# Patient Record
Sex: Male | Born: 1975 | Race: White | Hispanic: No | Marital: Single | Smoking: Never smoker
Health system: Southern US, Community
[De-identification: ages and names within clinical notes are randomized; demographics above are authoritative.]

## PROBLEM LIST (undated history)

## (undated) DIAGNOSIS — F419 Anxiety disorder, unspecified: Secondary | ICD-10-CM

## (undated) DIAGNOSIS — H7090 Unspecified mastoiditis, unspecified ear: Secondary | ICD-10-CM

## (undated) DIAGNOSIS — H7092 Unspecified mastoiditis, left ear: Secondary | ICD-10-CM

## (undated) HISTORY — PX: OTHER SURGICAL HISTORY: SHX169

## (undated) HISTORY — PX: MASTOIDECTOMY REVISION: SUR856

## (undated) HISTORY — PX: EAR CYST EXCISION: SHX22

---

## 1998-05-13 ENCOUNTER — Emergency Department (HOSPITAL_COMMUNITY): Admission: EM | Admit: 1998-05-13 | Discharge: 1998-05-13 | Payer: Self-pay | Admitting: Internal Medicine

## 1998-05-13 ENCOUNTER — Encounter: Payer: Self-pay | Admitting: Internal Medicine

## 1998-05-18 ENCOUNTER — Encounter: Admission: RE | Admit: 1998-05-18 | Discharge: 1998-05-18 | Payer: Self-pay | Admitting: *Deleted

## 1998-07-04 ENCOUNTER — Emergency Department (HOSPITAL_COMMUNITY): Admission: EM | Admit: 1998-07-04 | Discharge: 1998-07-04 | Payer: Self-pay | Admitting: Emergency Medicine

## 1998-08-30 ENCOUNTER — Emergency Department (HOSPITAL_COMMUNITY): Admission: EM | Admit: 1998-08-30 | Discharge: 1998-08-31 | Payer: Self-pay | Admitting: Emergency Medicine

## 1998-08-30 ENCOUNTER — Encounter: Payer: Self-pay | Admitting: Emergency Medicine

## 1998-10-31 ENCOUNTER — Other Ambulatory Visit: Admission: RE | Admit: 1998-10-31 | Discharge: 1998-10-31 | Payer: Self-pay | Admitting: Otolaryngology

## 2002-01-04 ENCOUNTER — Emergency Department (HOSPITAL_COMMUNITY): Admission: EM | Admit: 2002-01-04 | Discharge: 2002-01-04 | Payer: Self-pay | Admitting: *Deleted

## 2003-03-24 ENCOUNTER — Emergency Department (HOSPITAL_COMMUNITY): Admission: EM | Admit: 2003-03-24 | Discharge: 2003-03-25 | Payer: Self-pay | Admitting: Emergency Medicine

## 2003-04-05 ENCOUNTER — Encounter: Payer: Self-pay | Admitting: Sports Medicine

## 2003-04-05 ENCOUNTER — Encounter: Payer: Self-pay | Admitting: Internal Medicine

## 2003-04-05 ENCOUNTER — Ambulatory Visit (HOSPITAL_COMMUNITY): Admission: RE | Admit: 2003-04-05 | Discharge: 2003-04-05 | Payer: Self-pay | Admitting: Sports Medicine

## 2003-04-05 ENCOUNTER — Ambulatory Visit (HOSPITAL_COMMUNITY): Admission: RE | Admit: 2003-04-05 | Discharge: 2003-04-05 | Payer: Self-pay | Admitting: Internal Medicine

## 2004-11-20 ENCOUNTER — Ambulatory Visit (HOSPITAL_COMMUNITY): Admission: RE | Admit: 2004-11-20 | Discharge: 2004-11-20 | Payer: Self-pay | Admitting: Family Medicine

## 2005-11-22 ENCOUNTER — Ambulatory Visit (HOSPITAL_COMMUNITY): Admission: RE | Admit: 2005-11-22 | Discharge: 2005-11-22 | Payer: Self-pay | Admitting: Family Medicine

## 2006-07-14 ENCOUNTER — Emergency Department (HOSPITAL_COMMUNITY): Admission: EM | Admit: 2006-07-14 | Discharge: 2006-07-14 | Payer: Self-pay | Admitting: Emergency Medicine

## 2006-07-16 ENCOUNTER — Emergency Department (HOSPITAL_COMMUNITY): Admission: EM | Admit: 2006-07-16 | Discharge: 2006-07-16 | Payer: Self-pay | Admitting: Emergency Medicine

## 2007-04-29 ENCOUNTER — Emergency Department (HOSPITAL_COMMUNITY): Admission: EM | Admit: 2007-04-29 | Discharge: 2007-04-29 | Payer: Self-pay | Admitting: Emergency Medicine

## 2007-09-02 ENCOUNTER — Inpatient Hospital Stay (HOSPITAL_COMMUNITY): Admission: EM | Admit: 2007-09-02 | Discharge: 2007-09-03 | Payer: Self-pay | Admitting: Emergency Medicine

## 2008-05-10 ENCOUNTER — Emergency Department (HOSPITAL_COMMUNITY): Admission: EM | Admit: 2008-05-10 | Discharge: 2008-05-10 | Payer: Self-pay | Admitting: Emergency Medicine

## 2008-05-18 ENCOUNTER — Emergency Department (HOSPITAL_COMMUNITY): Admission: EM | Admit: 2008-05-18 | Discharge: 2008-05-18 | Payer: Self-pay | Admitting: Emergency Medicine

## 2008-12-28 ENCOUNTER — Emergency Department (HOSPITAL_BASED_OUTPATIENT_CLINIC_OR_DEPARTMENT_OTHER): Admission: EM | Admit: 2008-12-28 | Discharge: 2008-12-28 | Payer: Self-pay | Admitting: Emergency Medicine

## 2009-06-01 ENCOUNTER — Emergency Department (HOSPITAL_BASED_OUTPATIENT_CLINIC_OR_DEPARTMENT_OTHER): Admission: EM | Admit: 2009-06-01 | Discharge: 2009-06-01 | Payer: Self-pay | Admitting: Emergency Medicine

## 2009-06-01 ENCOUNTER — Ambulatory Visit: Payer: Self-pay | Admitting: Diagnostic Radiology

## 2009-06-02 ENCOUNTER — Emergency Department (HOSPITAL_BASED_OUTPATIENT_CLINIC_OR_DEPARTMENT_OTHER): Admission: EM | Admit: 2009-06-02 | Discharge: 2009-06-02 | Payer: Self-pay | Admitting: Emergency Medicine

## 2010-02-12 ENCOUNTER — Emergency Department (HOSPITAL_COMMUNITY): Admission: EM | Admit: 2010-02-12 | Discharge: 2010-02-12 | Payer: Self-pay | Admitting: Emergency Medicine

## 2010-09-17 ENCOUNTER — Encounter: Payer: Self-pay | Admitting: Internal Medicine

## 2010-11-12 LAB — BASIC METABOLIC PANEL
BUN: 15 mg/dL (ref 6–23)
CO2: 24 mEq/L (ref 19–32)
Chloride: 108 mEq/L (ref 96–112)
Creatinine, Ser: 0.89 mg/dL (ref 0.4–1.5)
GFR calc non Af Amer: >60 mL/min (ref >60)
Potassium: 3.8 mEq/L (ref 3.5–5.1)
Sodium: 136 mEq/L (ref 135–145)

## 2010-11-12 LAB — DIFFERENTIAL
Basophils Relative: 0 % (ref 0–1)
Eosinophils Relative: 5 % (ref 0–5)
Lymphs Abs: 1.2 10*3/uL (ref 0.7–4.0)
Monocytes Relative: 9 % (ref 3–12)
Neutro Abs: 3.8 10*3/uL (ref 1.7–7.7)

## 2010-11-12 LAB — ETHANOL: Alcohol, Ethyl (B): <5 mg/dL (ref 0–10)

## 2010-11-12 LAB — RAPID URINE DRUG SCREEN, HOSP PERFORMED
Amphetamines: NOT DETECTED
Barbiturates: NOT DETECTED
Benzodiazepines: POSITIVE — AB
Opiates: POSITIVE — AB
Tetrahydrocannabinol: NOT DETECTED

## 2010-11-12 LAB — CBC
HCT: 44.2 % (ref 39.0–52.0)
Platelets: 340 10*3/uL (ref 150–400)
RBC: 4.89 MIL/uL (ref 4.22–5.81)
WBC: 5.7 10*3/uL (ref 4.0–10.5)

## 2010-11-12 LAB — TRICYCLICS SCREEN, URINE: TCA Scrn: NOT DETECTED
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
## 2011-01-26 ENCOUNTER — Emergency Department (HOSPITAL_COMMUNITY)
Admission: EM | Admit: 2011-01-26 | Discharge: 2011-01-27 | Disposition: A | Discharge status: Home / Self Care | Payer: Self-pay | Attending: Emergency Medicine | Admitting: Emergency Medicine

## 2011-01-26 DIAGNOSIS — Y998 Other external cause status: Secondary | ICD-10-CM | POA: Insufficient documentation

## 2011-01-26 DIAGNOSIS — S51809A Unspecified open wound of unspecified forearm, initial encounter: Secondary | ICD-10-CM | POA: Insufficient documentation

## 2011-01-26 DIAGNOSIS — S6000XA Contusion of unspecified finger without damage to nail, initial encounter: Secondary | ICD-10-CM | POA: Insufficient documentation

## 2011-01-26 DIAGNOSIS — F17200 Nicotine dependence, unspecified, uncomplicated: Secondary | ICD-10-CM | POA: Insufficient documentation

## 2011-01-26 DIAGNOSIS — IMO0002 Reserved for concepts with insufficient information to code with codable children: Secondary | ICD-10-CM | POA: Insufficient documentation

## 2011-01-27 ENCOUNTER — Emergency Department (HOSPITAL_COMMUNITY): Payer: Self-pay

## 2011-05-17 LAB — CBC
HCT: 44.2
Hemoglobin: 15
MCHC: 33.9
MCHC: 34.4
MCV: 90.2
Platelets: 291
Platelets: 314
RBC: 4.9
RDW: 12.8
RDW: 12.9
WBC: 15.1 — ABNORMAL HIGH

## 2011-05-17 LAB — I-STAT 8, (EC8 V) (CONVERTED LAB)
Acid-base deficit: 3 — ABNORMAL HIGH
Bicarbonate: 23.3
Chloride: 110
Glucose, Bld: 87
Sodium: 141
TCO2: 25

## 2011-05-17 LAB — POCT CARDIAC MARKERS
CKMB, poc: 3.3
Myoglobin, poc: 482
Operator id: 272551

## 2011-05-17 LAB — DIFFERENTIAL
Basophils Absolute: 0
Basophils Relative: 0
Eosinophils Absolute: 0.1
Eosinophils Relative: 0

## 2011-05-17 LAB — ETHANOL

## 2011-05-17 LAB — BASIC METABOLIC PANEL
BUN: 7
CO2: 23
Calcium: 8.6
Creatinine, Ser: 0.94
GFR calc non Af Amer: >60
Glucose, Bld: 141 — ABNORMAL HIGH
Sodium: 138

## 2011-05-17 LAB — POCT I-STAT CREATININE
Creatinine, Ser: 1.1
Operator id: 272551

## 2011-05-23 DIAGNOSIS — H9209 Otalgia, unspecified ear: Secondary | ICD-10-CM | POA: Insufficient documentation

## 2011-05-24 ENCOUNTER — Emergency Department (HOSPITAL_BASED_OUTPATIENT_CLINIC_OR_DEPARTMENT_OTHER)
Admission: EM | Admit: 2011-05-24 | Discharge: 2011-05-24 | Disposition: A | Discharge status: Home / Self Care | Payer: Self-pay | Attending: Emergency Medicine | Admitting: Emergency Medicine

## 2011-05-24 ENCOUNTER — Encounter: Payer: Self-pay | Admitting: Emergency Medicine

## 2011-05-24 DIAGNOSIS — H9202 Otalgia, left ear: Secondary | ICD-10-CM

## 2011-05-24 HISTORY — DX: Unspecified mastoiditis, left ear: H70.92

## 2011-05-24 MED ORDER — NEOMYCIN-COLIST-HC-THONZONIUM 3.3-3-10-0.5 MG/ML OT SUSP
4.0000 [drp] | Freq: Four times a day (QID) | OTIC | Status: DC
  Administered 2011-05-24: 4 [drp] via OTIC
  Filled 2011-05-24: qty 5

## 2011-05-24 MED ORDER — AMOXICILLIN 500 MG PO CAPS: 1000.0000 mg | ORAL_CAPSULE | Freq: Three times a day (TID) | ORAL | Status: AC

## 2011-05-24 MED ORDER — HYDROCODONE-ACETAMINOPHEN 5-325 MG PO TABS: 1.0000 | ORAL_TABLET | Freq: Four times a day (QID) | ORAL | Status: AC | PRN

## 2011-05-24 NOTE — ED Notes (Signed)
Pt reports left sided earache x1 month. Worsening in degree of pain. Denies injury. Hx of mastoiditis and states this feels similar. Does not have a PCP or ENT physician at this time. C/o some drainage at time. Ice packs help to relieve pain. Has been taking Advil without much relief. Unable to visualize left inner eardrum d/t large amount of cerumen.

## 2011-05-24 NOTE — ED Notes (Signed)
Dr Read Drivers has assessed pt

## 2011-05-24 NOTE — ED Provider Notes (Addendum)
History     CSN: [id number] Arrival date & time: 05/24/2011 12:06 AM  Chief Complaint  Patient presents with  . Otalgia    Pt reports Hx of mastoiditis with surgical intervention in 2008    (Consider location/radiation/quality/duration/timing/severity/associated sxs/prior treatment) HPI This is a 35 year old white male with a history of left mastoid surgery in 2001 and again in 2008. He presents with a one-month history of pain in his left ear radiating to the left side of his face. There's been no associated fever. Pain is worse with movement of the left ear and with certain positions of the head. Hearing is decreased in the left ear, worse than baseline. He has been taking Advil without adequate relief. Pain is moderate to severe at its worst. He has had intermittent vertigo.  Past Medical History  Diagnosis Date  . Mastoiditis of left side     Past Surgical History  Procedure Date  . Mastoidectomy revision     History reviewed. No pertinent family history.  History  Substance Use Topics  . Smoking status: Former Games developer  . Smokeless tobacco: Not on file  . Alcohol Use: No      Review of Systems  All other systems reviewed and are negative.    Allergies  Review of patient's allergies indicates no known allergies.  Home Medications   Current Outpatient Rx  Name Route Sig Dispense Refill  . IBUPROFEN 200 MG PO TABS Oral Take 800 mg by mouth every 6 (six) hours as needed.        BP 120/84  Pulse 88  Temp(Src) 98.1 F (36.7 C) (Oral)  Resp 16  SpO2 100%  Physical Exam General: Well-developed, well-nourished male in no acute distress; appearance consistent with age of record HENT: normocephalic, atraumatic; right TM pearly gray with light reflex; left TM obscured by exudate and cerumen and left external auditory canal; pain on movement of left external ear; no left mastoid tenderness on percussion Eyes: pupils equal round and reactive to light; extraocular  muscles intact Neck: supple; no lymphadenopathy Heart: regular rate and rhythm Lungs: Normal respiratory effort and excursion Abdomen: nontender; nondistended Extremities: No deformity; full range of motion Neurologic: Awake, alert and oriented;motor function intact in all extremities and symmetric; no facial droop Skin: Warm and dry   ED Course  Procedures (including critical care time) Left ear was placed to facilitate administration of Cortisporin drops.  Labs Reviewed - No data to display No results found.   No diagnosis found.    MDM  We'll treat for both otitis externa and media. Patient is to followup with his ENT specialist, Dr. Jena Gauss.        Hanley Seamen, MD 05/24/11 0214  Hanley Seamen, MD 05/24/11 9147

## 2011-05-24 NOTE — ED Notes (Signed)
Pt reports Increased pain and discomfort in L ear with Hx of mastoiditis decreased hearing

## 2011-05-24 NOTE — ED Notes (Signed)
Pt given Cortisporin TO GO and instructed on usage. Verbalized understanding.

## 2011-05-28 LAB — DIFFERENTIAL
Basophils Absolute: 0
Lymphocytes Relative: 17
Lymphs Abs: 1.7
Monocytes Absolute: 0.8
Neutro Abs: 7.1

## 2011-05-28 LAB — BASIC METABOLIC PANEL
Calcium: 9.8
GFR calc Af Amer: >60
GFR calc non Af Amer: >60
Glucose, Bld: 103 — ABNORMAL HIGH
Potassium: 3.2 — ABNORMAL LOW
Sodium: 137

## 2011-05-28 LAB — CBC
HCT: 47
Hemoglobin: 16
RBC: 5.17
RDW: 12.7
WBC: 9.6

## 2012-02-20 ENCOUNTER — Encounter (HOSPITAL_COMMUNITY): Payer: Self-pay | Admitting: Emergency Medicine

## 2012-02-20 ENCOUNTER — Emergency Department (HOSPITAL_COMMUNITY)
Admission: EM | Admit: 2012-02-20 | Discharge: 2012-02-20 | Disposition: A | Discharge status: Home / Self Care | Payer: Self-pay | Attending: Emergency Medicine | Admitting: Emergency Medicine

## 2012-02-20 DIAGNOSIS — H9209 Otalgia, unspecified ear: Secondary | ICD-10-CM | POA: Insufficient documentation

## 2012-02-20 DIAGNOSIS — Z87891 Personal history of nicotine dependence: Secondary | ICD-10-CM | POA: Insufficient documentation

## 2012-02-20 MED ORDER — NEOMYCIN-POLYMYXIN-HC 3.5-10000-1 OT SUSP: 1.0000 [drp] | Freq: Every day | OTIC | Status: DC

## 2012-02-20 MED ORDER — AMOXICILLIN 500 MG PO CAPS: 500.0000 mg | ORAL_CAPSULE | Freq: Three times a day (TID) | ORAL | Status: DC

## 2012-02-20 MED ORDER — OXYCODONE-ACETAMINOPHEN 5-325 MG PO TABS: 1.0000 | ORAL_TABLET | ORAL | Status: DC | PRN
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
## 2012-02-27 ENCOUNTER — Emergency Department (HOSPITAL_COMMUNITY)
Admission: EM | Admit: 2012-02-27 | Discharge: 2012-02-27 | Disposition: A | Discharge status: Home / Self Care | Payer: Self-pay | Attending: Emergency Medicine | Admitting: Emergency Medicine

## 2012-02-27 ENCOUNTER — Encounter (HOSPITAL_COMMUNITY): Payer: Self-pay | Admitting: Emergency Medicine

## 2012-02-27 DIAGNOSIS — H9209 Otalgia, unspecified ear: Secondary | ICD-10-CM | POA: Insufficient documentation

## 2012-02-27 DIAGNOSIS — H9202 Otalgia, left ear: Secondary | ICD-10-CM

## 2012-02-27 DIAGNOSIS — Z87891 Personal history of nicotine dependence: Secondary | ICD-10-CM | POA: Insufficient documentation

## 2012-02-27 MED ORDER — OXYCODONE-ACETAMINOPHEN 5-325 MG PO TABS
1.0000 | ORAL_TABLET | Freq: Once | ORAL | Status: AC
  Administered 2012-02-27: 1 via ORAL
  Filled 2012-02-27: qty 1

## 2012-02-27 MED ORDER — AMOXICILLIN 500 MG PO CAPS: 500.0000 mg | ORAL_CAPSULE | Freq: Three times a day (TID) | ORAL | Status: AC

## 2012-02-27 MED ORDER — OXYCODONE-ACETAMINOPHEN 5-325 MG PO TABS: 1.0000 | ORAL_TABLET | ORAL | Status: AC | PRN

## 2012-02-27 NOTE — ED Provider Notes (Signed)
Medical screening examination/treatment/procedure(s) were performed by non-physician practitioner and as supervising physician I was immediately available for consultation/collaboration. Devoria Albe, MD, Armando Gang   Ward Givens, MD 02/27/12 [phone]
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
## 2012-02-27 NOTE — ED Notes (Signed)
Pt seen for same a few weeks ago here. Pt states out of medications he was given. Unable to see ear doctor for two more weeks.

## 2012-06-20 ENCOUNTER — Emergency Department (HOSPITAL_COMMUNITY): Payer: Self-pay

## 2012-06-20 ENCOUNTER — Encounter (HOSPITAL_COMMUNITY): Payer: Self-pay | Admitting: *Deleted

## 2012-06-20 ENCOUNTER — Emergency Department (HOSPITAL_COMMUNITY)
Admission: EM | Admit: 2012-06-20 | Discharge: 2012-06-20 | Disposition: A | Discharge status: Home / Self Care | Payer: Self-pay | Attending: Emergency Medicine | Admitting: Emergency Medicine

## 2012-06-20 DIAGNOSIS — Y929 Unspecified place or not applicable: Secondary | ICD-10-CM | POA: Insufficient documentation

## 2012-06-20 DIAGNOSIS — Z87891 Personal history of nicotine dependence: Secondary | ICD-10-CM | POA: Insufficient documentation

## 2012-06-20 DIAGNOSIS — S40022A Contusion of left upper arm, initial encounter: Secondary | ICD-10-CM

## 2012-06-20 DIAGNOSIS — W010XXA Fall on same level from slipping, tripping and stumbling without subsequent striking against object, initial encounter: Secondary | ICD-10-CM | POA: Insufficient documentation

## 2012-06-20 DIAGNOSIS — Y9301 Activity, walking, marching and hiking: Secondary | ICD-10-CM | POA: Insufficient documentation

## 2012-06-20 DIAGNOSIS — Z8669 Personal history of other diseases of the nervous system and sense organs: Secondary | ICD-10-CM | POA: Insufficient documentation

## 2012-06-20 DIAGNOSIS — Z791 Long term (current) use of non-steroidal anti-inflammatories (NSAID): Secondary | ICD-10-CM | POA: Insufficient documentation

## 2012-06-20 DIAGNOSIS — Z8781 Personal history of (healed) traumatic fracture: Secondary | ICD-10-CM | POA: Insufficient documentation

## 2012-06-20 DIAGNOSIS — S40029A Contusion of unspecified upper arm, initial encounter: Secondary | ICD-10-CM | POA: Insufficient documentation

## 2012-06-20 MED ORDER — IBUPROFEN 800 MG PO TABS
800.0000 mg | ORAL_TABLET | Freq: Once | ORAL | Status: AC
  Administered 2012-06-20: 800 mg via ORAL
  Filled 2012-06-20: qty 1

## 2012-06-20 MED ORDER — OXYCODONE-ACETAMINOPHEN 5-325 MG PO TABS: 1.0000 | ORAL_TABLET | Freq: Four times a day (QID) | ORAL | Status: DC | PRN

## 2012-06-20 MED ORDER — IBUPROFEN 800 MG PO TABS: 800.0000 mg | ORAL_TABLET | Freq: Three times a day (TID) | ORAL | Status: DC | PRN

## 2012-06-20 MED ORDER — OXYCODONE-ACETAMINOPHEN 5-325 MG PO TABS
1.0000 | ORAL_TABLET | Freq: Once | ORAL | Status: AC
  Administered 2012-06-20: 1 via ORAL
  Filled 2012-06-20: qty 1

## 2012-06-20 NOTE — ED Notes (Signed)
Pt reports  That he was walking his dog this am, tripped and fell, now c/o left arm pain and is able to move ext. On command. +cap refill and pulses to ext.  Denies any loc, moderate bruising noted to humerus area.
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
## 2012-06-20 NOTE — ED Provider Notes (Signed)
History     CSN: [id number]  Arrival date & time 06/20/12  7829   First MD Initiated Contact with Patient 06/20/12 1013      Chief Complaint  Patient presents with  . Arm Injury    (Consider location/radiation/quality/duration/timing/severity/associated sxs/prior treatment) HPI Comments: Michael Price presents for evaluation of pain, swelling and bruising in his left upper arm after falling this morning around 4:30 while walking his dog.  He describes tripping over a stone in his yard and landed against the left upper extremity.  He does have a history of fracture to this arm and is concerned about possible reinjury.  He denies weakness or numbness distal to the injury site.  He has taken no medications prior to arrival nor has he attempted any of your treatment.  Pain is worse with palpation and range of motion, and is now radiating into his left shoulder.  He can range both his elbow and shoulder but with discomfort.  The history is provided by the patient.    Past Medical History  Diagnosis Date  . Mastoiditis of left side     Past Surgical History  Procedure Date  . Mastoidectomy revision   . Othropedic     No family history on file.  History  Substance Use Topics  . Smoking status: Former Games developer  . Smokeless tobacco: Not on file  . Alcohol Use: No      Review of Systems  Musculoskeletal: Positive for arthralgias. Negative for joint swelling.  Skin: Positive for color change. Negative for wound.  Neurological: Negative for weakness and numbness.    Allergies  Review of patient's allergies indicates no known allergies.  Home Medications   Current Outpatient Rx  Name Route Sig Dispense Refill  . IBUPROFEN 200 MG PO TABS Oral Take 800 mg by mouth every 6 (six) hours as needed. Pain    . IBUPROFEN 800 MG PO TABS Oral Take 1 tablet (800 mg total) by mouth every 8 (eight) hours as needed for pain. 21 tablet 0  . OXYCODONE-ACETAMINOPHEN 5-325 MG PO TABS Oral  Take 1 tablet by mouth every 6 (six) hours as needed for pain. 20 tablet 0    BP 119/82  Temp 97.6 F (36.4 C) (Oral)  Resp 16  SpO2 96%  Physical Exam  Constitutional: He appears well-developed and well-nourished.  HENT:  Head: Atraumatic.  Neck: Normal range of motion.  Cardiovascular:       Pulses equal bilaterally  Musculoskeletal: He exhibits edema and tenderness.       Left upper arm: He exhibits tenderness and swelling.       Arms:      No deformity to palpation along bicep and tricep musculature.  No cervical point tenderness.  Tender to palpation lateral left shoulder.  No crepitus and elbow or shoulder with range of motion.  Distal sensation is intact in the left upper extremity.  Radial pulse 2+, less than 3 second cap refill.  Neurological: He is alert. He has normal strength. He displays normal reflexes. No sensory deficit.       Equal strength  Skin: Skin is warm and dry.  Psychiatric: He has a normal mood and affect.    ED Course  Procedures (including critical care time)  Labs Reviewed - No data to display Dg Shoulder Left  06/20/2012  *RADIOLOGY REPORT*  Clinical Data: Left shoulder pain and bruising.  Status post fall.  LEFT SHOULDER - 2+ VIEW  Comparison: Plain films of  the humerus 09/02/2007.  Findings: No acute bony or joint abnormality is identified.  There is partial visualization of an old healed diaphyseal fracture of the left humerus.  Imaged right lung demonstrates mild linear atelectasis or scar.  IMPRESSION:  1.  No acute finding. 2.  Old healed humerus fracture.   Original Report Authenticated By: Bernadene Bell. D'ALESSIO, M.D.    Dg Humerus Left  06/20/2012  *RADIOLOGY REPORT*  Clinical Data: Fall, pain.  LEFT HUMERUS - 2+ VIEW  Comparison: Plain films 09/02/2007.  Findings: There is no acute bony or joint abnormality.  The patient has an old healed fracture of the mid shaft of the left humerus with one shaft width posterior displacement and some fragment  override.  Solid bridging bone is present across the fracture.  IMPRESSION: No acute finding.  Old healed left humerus fracture.   Original Report Authenticated By: Bernadene Bell. D'ALESSIO, M.D.      1. Contusion of arm, left       MDM  Patient placed in sling for comfort.  He was prescribed ibuprofen and oxycodone, encouraged ice therapy for the next 2 days, can add heat on day 3.  Recheck by PCP if not improving within the next several days.        Burgess Amor, [place] 06/20/12 1123

## 2012-06-22 NOTE — ED Provider Notes (Signed)
Medical screening examination/treatment/procedure(s) were performed by non-physician practitioner and as supervising physician I was immediately available for consultation/collaboration.   Laray Anger, DO 06/22/12 1625

## 2012-06-27 ENCOUNTER — Emergency Department (HOSPITAL_COMMUNITY)
Admission: EM | Admit: 2012-06-27 | Discharge: 2012-06-27 | Disposition: A | Discharge status: Home / Self Care | Payer: Self-pay | Attending: Emergency Medicine | Admitting: Emergency Medicine

## 2012-06-27 ENCOUNTER — Emergency Department (HOSPITAL_COMMUNITY): Payer: Self-pay

## 2012-06-27 ENCOUNTER — Encounter (HOSPITAL_COMMUNITY): Payer: Self-pay

## 2012-06-27 DIAGNOSIS — S40029A Contusion of unspecified upper arm, initial encounter: Secondary | ICD-10-CM | POA: Insufficient documentation

## 2012-06-27 DIAGNOSIS — W1789XA Other fall from one level to another, initial encounter: Secondary | ICD-10-CM | POA: Insufficient documentation

## 2012-06-27 DIAGNOSIS — H7090 Unspecified mastoiditis, unspecified ear: Secondary | ICD-10-CM | POA: Insufficient documentation

## 2012-06-27 DIAGNOSIS — S5000XA Contusion of unspecified elbow, initial encounter: Secondary | ICD-10-CM | POA: Insufficient documentation

## 2012-06-27 DIAGNOSIS — S5002XA Contusion of left elbow, initial encounter: Secondary | ICD-10-CM

## 2012-06-27 DIAGNOSIS — Y939 Activity, unspecified: Secondary | ICD-10-CM | POA: Insufficient documentation

## 2012-06-27 DIAGNOSIS — S40022A Contusion of left upper arm, initial encounter: Secondary | ICD-10-CM

## 2012-06-27 DIAGNOSIS — Y92009 Unspecified place in unspecified non-institutional (private) residence as the place of occurrence of the external cause: Secondary | ICD-10-CM | POA: Insufficient documentation

## 2012-06-27 MED ORDER — HYDROCODONE-ACETAMINOPHEN 7.5-325 MG PO TABS: 1.0000 | ORAL_TABLET | ORAL | Status: DC | PRN

## 2012-06-27 MED ORDER — IBUPROFEN 800 MG PO TABS
800.0000 mg | ORAL_TABLET | Freq: Once | ORAL | Status: AC
  Administered 2012-06-27: 800 mg via ORAL
  Filled 2012-06-27: qty 1

## 2012-06-27 MED ORDER — OXYCODONE-ACETAMINOPHEN 5-325 MG PO TABS
1.0000 | ORAL_TABLET | Freq: Once | ORAL | Status: AC
  Administered 2012-06-27: 1 via ORAL
  Filled 2012-06-27: qty 1

## 2012-06-27 NOTE — ED Provider Notes (Signed)
History     CSN: [id number]  Arrival date & time 06/27/12  4098   First MD Initiated Contact with Patient 06/27/12 [phone]      Chief Complaint  Patient presents with  . Arm Injury    (Consider location/radiation/quality/duration/timing/severity/associated sxs/prior treatment) HPI Comments: Patient with hx of frature to left humerus several years ago, c/o pain and bruising to the left elbow and upper arm after suffering a fall just PTA.  Landed on his arm.  States the pain is worse with extension of his arm.  Pt reports having orthopedic treatment of his previous fracture, but states "it didn't heal right"" and states he continues to have chronic pain in his arm.  He also reports slight tingling sensation to his elbow.  He denies head injury, shoulder or neck pain, back pain or LOC.  Pt was also seen here recently and treated for similar pain to his arm and states he is out of the pain medication given to him on the previous visit  Patient is a 36 y.o. male presenting with arm injury. The history is provided by the patient.  Arm Injury  The incident occurred just prior to arrival. The incident occurred at home. The injury mechanism was a fall and a direct blow. The wounds were self-inflicted. No protective equipment was used. There is an injury to the left upper arm and left elbow. The pain is moderate. It is unlikely that a foreign body is present. Associated symptoms include tingling. Pertinent negatives include no chest pain, no fussiness, no numbness, no nausea, no vomiting, no headaches, no inability to bear weight, no neck pain, no focal weakness, no decreased responsiveness, no loss of consciousness, no weakness and no difficulty breathing. There have been prior injuries to these areas. He is right-handed. He has been behaving normally. There were no sick contacts. Recently, medical care has been given at this facility. Services received include tests performed, medications given and one or  more referrals.    Past Medical History  Diagnosis Date  . Mastoiditis of left side     Past Surgical History  Procedure Date  . Mastoidectomy revision   . Othropedic     No family history on file.  History  Substance Use Topics  . Smoking status: Former Games developer  . Smokeless tobacco: Not on file  . Alcohol Use: No      Review of Systems  Constitutional: Negative for fever, chills and decreased responsiveness.  HENT: Negative for neck pain.   Respiratory: Negative for shortness of breath.   Cardiovascular: Negative for chest pain.  Gastrointestinal: Negative for nausea and vomiting.  Genitourinary: Negative for dysuria and difficulty urinating.  Musculoskeletal: Positive for joint swelling and arthralgias. Negative for back pain.  Skin: Positive for color change. Negative for wound.       Bruising to left elbow and upper arm  Neurological: Positive for tingling. Negative for dizziness, focal weakness, loss of consciousness, weakness, numbness and headaches.  All other systems reviewed and are negative.    Allergies  Review of patient's allergies indicates no known allergies.  Home Medications   Current Outpatient Rx  Name Route Sig Dispense Refill  . IBUPROFEN 200 MG PO TABS Oral Take 800 mg by mouth every 6 (six) hours as needed. Pain    . OXYCODONE-ACETAMINOPHEN 5-325 MG PO TABS Oral Take 1 tablet by mouth every 6 (six) hours as needed for pain. 20 tablet 0  . IBUPROFEN 800 MG PO TABS Oral Take 1  tablet (800 mg total) by mouth every 8 (eight) hours as needed for pain. 21 tablet 0    BP 126/99  Pulse 109  Temp 98.6 F (37 C) (Oral)  Resp 16  Ht 6' (1.829 m)  Wt 227 lb (102.967 kg)  BMI 30.79 kg/m2  SpO2 100%  Physical Exam  Nursing note and vitals reviewed. Constitutional: He is oriented to person, place, and time. He appears well-developed and well-nourished. No distress.  HENT:  Head: Normocephalic and atraumatic.  Neck: Normal range of motion. Neck  supple. No thyromegaly present.  Cardiovascular: Normal rate, regular rhythm, normal heart sounds and intact distal pulses.   No murmur heard. Pulmonary/Chest: Effort normal and breath sounds normal. No respiratory distress. He exhibits no tenderness.  Musculoskeletal: He exhibits tenderness. He exhibits no edema.       Left elbow: He exhibits swelling. He exhibits normal range of motion, no effusion and no deformity. tenderness found. Medial epicondyle tenderness noted.       Left upper arm: He exhibits tenderness and swelling. He exhibits no bony tenderness, no deformity and no laceration.       Arms:      ttp of the anterior left upper arm and medial elbow.  Moderate bruising to same.  Radial pulse is brisk, distal sensation intact, CR< 2 sec.  Grip strength is equal and strong bilaterally.  Left shoulder is NT  Lymphadenopathy:    He has no cervical adenopathy.  Neurological: He is alert and oriented to person, place, and time. He exhibits normal muscle tone. Coordination normal.  Skin: Skin is warm and dry.    ED Course  Procedures (including critical care time)  Labs Reviewed - No data to display No results found.   Dg Elbow Complete Left  06/27/2012  *RADIOLOGY REPORT*  Clinical Data: 36 year old male with pain at the elbow.  History of left femur fracture.  LEFT ELBOW - COMPLETE 3+ VIEW  Comparison: Left humerus series 06/20/2012, left forearm series 01/27/2011.  Findings: Chronic mid left humeral shaft deformity partially visible.  No elbow joint effusion.  Normal joint spaces and alignment.  Radial head appears intact.  No acute fracture identified.  IMPRESSION: No acute fracture or dislocation identified about the left elbow. Partially visible remote left humeral shaft fracture.   Original Report Authenticated By: Erskine Speed, M.D.    Dg Shoulder Left  06/20/2012  *RADIOLOGY REPORT*  Clinical Data: Left shoulder pain and bruising.  Status post fall.  LEFT SHOULDER - 2+ VIEW   Comparison: Plain films of the humerus 09/02/2007.  Findings: No acute bony or joint abnormality is identified.  There is partial visualization of an old healed diaphyseal fracture of the left humerus.  Imaged right lung demonstrates mild linear atelectasis or scar.  IMPRESSION:  1.  No acute finding. 2.  Old healed humerus fracture.   Original Report Authenticated By: Bernadene Bell. D'ALESSIO, M.D.    Dg Humerus Left  06/20/2012  *RADIOLOGY REPORT*  Clinical Data: Fall, pain.  LEFT HUMERUS - 2+ VIEW  Comparison: Plain films 09/02/2007.  Findings: There is no acute bony or joint abnormality.  The patient has an old healed fracture of the mid shaft of the left humerus with one shaft width posterior displacement and some fragment override.  Solid bridging bone is present across the fracture.  IMPRESSION: No acute finding.  Old healed left humerus fracture.   Original Report Authenticated By: Bernadene Bell. Maricela Curet, M.D.      MDM  Previous ED chart reviewed by me.  Hx of old humerus fx fm 2010.  Patient reports surgical repair by an orthopedist at [facility], but no surgical scars seen on exam.    10:00 am Consulted Dr. Hilda Lias, recommended sling and swath.  Will see pt in office on Monday  Prescribed: norco #20    Paullette Mckain L. [place], [place] 06/28/12 2129

## 2012-06-27 NOTE — ED Notes (Addendum)
Pt with previous left arm injury had fall today landing on injured arm. C/O numbness and tingling in arm and hand. Pulse and sensation present. Pt now states that he is out of his percocet that he has been taking for the previous injury.

## 2012-07-05 NOTE — ED Provider Notes (Signed)
Medical screening examination/treatment/procedure(s) were performed by non-physician practitioner and as supervising physician I was immediately available for consultation/collaboration. Devoria Albe, MD, Armando Gang   Ward Givens, MD 07/05/12 [phone]

## 2012-12-08 ENCOUNTER — Emergency Department (HOSPITAL_COMMUNITY): Payer: Self-pay

## 2012-12-08 ENCOUNTER — Emergency Department (HOSPITAL_COMMUNITY)
Admission: EM | Admit: 2012-12-08 | Discharge: 2012-12-08 | Disposition: A | Discharge status: Home / Self Care | Payer: Self-pay | Attending: Emergency Medicine | Admitting: Emergency Medicine

## 2012-12-08 ENCOUNTER — Encounter (HOSPITAL_COMMUNITY): Payer: Self-pay | Admitting: Emergency Medicine

## 2012-12-08 DIAGNOSIS — IMO0002 Reserved for concepts with insufficient information to code with codable children: Secondary | ICD-10-CM | POA: Insufficient documentation

## 2012-12-08 DIAGNOSIS — Z8659 Personal history of other mental and behavioral disorders: Secondary | ICD-10-CM | POA: Insufficient documentation

## 2012-12-08 DIAGNOSIS — S0990XA Unspecified injury of head, initial encounter: Secondary | ICD-10-CM | POA: Insufficient documentation

## 2012-12-08 DIAGNOSIS — Z87891 Personal history of nicotine dependence: Secondary | ICD-10-CM | POA: Insufficient documentation

## 2012-12-08 DIAGNOSIS — H538 Other visual disturbances: Secondary | ICD-10-CM | POA: Insufficient documentation

## 2012-12-08 HISTORY — DX: Anxiety disorder, unspecified: F41.9

## 2012-12-08 MED ORDER — IBUPROFEN 800 MG PO TABS
800.0000 mg | ORAL_TABLET | Freq: Once | ORAL | Status: AC
  Administered 2012-12-08: 800 mg via ORAL
  Filled 2012-12-08: qty 1
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
## 2013-04-10 ENCOUNTER — Emergency Department (HOSPITAL_COMMUNITY)
Admission: EM | Admit: 2013-04-10 | Discharge: 2013-04-10 | Disposition: A | Discharge status: Home / Self Care | Payer: Worker's Compensation | Attending: Emergency Medicine | Admitting: Emergency Medicine

## 2013-04-10 ENCOUNTER — Emergency Department (HOSPITAL_COMMUNITY): Payer: Worker's Compensation

## 2013-04-10 DIAGNOSIS — Y990 Civilian activity done for income or pay: Secondary | ICD-10-CM | POA: Insufficient documentation

## 2013-04-10 DIAGNOSIS — R404 Transient alteration of awareness: Secondary | ICD-10-CM | POA: Insufficient documentation

## 2013-04-10 DIAGNOSIS — Z8669 Personal history of other diseases of the nervous system and sense organs: Secondary | ICD-10-CM | POA: Insufficient documentation

## 2013-04-10 DIAGNOSIS — S0990XA Unspecified injury of head, initial encounter: Secondary | ICD-10-CM

## 2013-04-10 DIAGNOSIS — Y9389 Activity, other specified: Secondary | ICD-10-CM | POA: Insufficient documentation

## 2013-04-10 DIAGNOSIS — W208XXA Other cause of strike by thrown, projected or falling object, initial encounter: Secondary | ICD-10-CM | POA: Insufficient documentation

## 2013-04-10 DIAGNOSIS — S0993XA Unspecified injury of face, initial encounter: Secondary | ICD-10-CM | POA: Insufficient documentation

## 2013-04-10 DIAGNOSIS — Z87891 Personal history of nicotine dependence: Secondary | ICD-10-CM | POA: Insufficient documentation

## 2013-04-10 DIAGNOSIS — Z79899 Other long term (current) drug therapy: Secondary | ICD-10-CM | POA: Insufficient documentation

## 2013-04-10 DIAGNOSIS — S199XXA Unspecified injury of neck, initial encounter: Secondary | ICD-10-CM | POA: Insufficient documentation

## 2013-04-10 DIAGNOSIS — R112 Nausea with vomiting, unspecified: Secondary | ICD-10-CM | POA: Insufficient documentation

## 2013-04-10 DIAGNOSIS — IMO0002 Reserved for concepts with insufficient information to code with codable children: Secondary | ICD-10-CM | POA: Insufficient documentation

## 2013-04-10 DIAGNOSIS — Z8659 Personal history of other mental and behavioral disorders: Secondary | ICD-10-CM | POA: Insufficient documentation

## 2013-04-10 DIAGNOSIS — Y9289 Other specified places as the place of occurrence of the external cause: Secondary | ICD-10-CM | POA: Insufficient documentation

## 2013-04-10 DIAGNOSIS — F29 Unspecified psychosis not due to a substance or known physiological condition: Secondary | ICD-10-CM | POA: Insufficient documentation

## 2013-04-10 MED ORDER — ONDANSETRON 4 MG PO TBDP
8.0000 mg | ORAL_TABLET | Freq: Once | ORAL | Status: AC
  Administered 2013-04-10: 8 mg via ORAL
  Filled 2013-04-10: qty 2

## 2013-04-10 MED ORDER — ONDANSETRON HCL 4 MG PO TABS: 4.0000 mg | ORAL_TABLET | Freq: Four times a day (QID) | ORAL | Status: DC

## 2013-04-10 MED ORDER — IBUPROFEN 800 MG PO TABS: 800.0000 mg | ORAL_TABLET | Freq: Three times a day (TID) | ORAL | Status: DC

## 2013-04-10 MED ORDER — IBUPROFEN 800 MG PO TABS
800.0000 mg | ORAL_TABLET | Freq: Once | ORAL | Status: AC
  Administered 2013-04-10: 800 mg via ORAL
  Filled 2013-04-10: qty 1

## 2013-04-10 MED ORDER — ONDANSETRON HCL 4 MG/2ML IJ SOLN: 4.0000 mg | Freq: Once | INTRAMUSCULAR | Status: DC

## 2013-04-10 MED ORDER — HYDROCODONE-ACETAMINOPHEN 5-325 MG PO TABS
2.0000 | ORAL_TABLET | Freq: Once | ORAL | Status: AC
  Administered 2013-04-10: 2 via ORAL
  Filled 2013-04-10 (×2): qty 1

## 2013-04-10 MED ORDER — HYDROCODONE-ACETAMINOPHEN 5-325 MG PO TABS: 2.0000 | ORAL_TABLET | ORAL | Status: DC | PRN

## 2013-04-10 NOTE — ED Notes (Addendum)
Pt tolerated fluids without any problems. When ambulating pt, pt states he felt very dizzy and that his vision was blurred. Pt was able to stand without swaying however pt needed to hold onto RN for support.

## 2013-04-10 NOTE — ED Notes (Addendum)
Pt states he is still in a lot of pain. Pt states his head and his back hurt a lot. Pt still sounds exhausted and his speech is still slightly slurred.

## 2013-04-10 NOTE — ED Notes (Signed)
Pt given coke after passing fluid challenge.
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
## 2013-04-10 NOTE — ED Notes (Signed)
Pt up to ambulate a second time, pt walked much fast and had a better steady gait. Pt states he is still slightly dizzy. Pt given a [place] sandwich.

## 2013-04-14 ENCOUNTER — Ambulatory Visit (HOSPITAL_COMMUNITY)
Admission: RE | Admit: 2013-04-14 | Discharge: 2013-04-14 | Disposition: A | Discharge status: Home / Self Care | Payer: Worker's Compensation | Source: Ambulatory Visit | Attending: Family Medicine | Admitting: Family Medicine

## 2013-04-14 ENCOUNTER — Telehealth: Payer: Self-pay | Admitting: Nurse Practitioner

## 2013-04-14 ENCOUNTER — Ambulatory Visit (HOSPITAL_COMMUNITY): Payer: Worker's Compensation

## 2013-04-14 ENCOUNTER — Other Ambulatory Visit: Payer: Self-pay | Admitting: Family Medicine

## 2013-04-14 DIAGNOSIS — R112 Nausea with vomiting, unspecified: Secondary | ICD-10-CM

## 2013-04-14 DIAGNOSIS — R519 Headache, unspecified: Secondary | ICD-10-CM

## 2013-04-14 DIAGNOSIS — R51 Headache: Secondary | ICD-10-CM | POA: Insufficient documentation

## 2013-04-14 DIAGNOSIS — W208XXA Other cause of strike by thrown, projected or falling object, initial encounter: Secondary | ICD-10-CM | POA: Insufficient documentation

## 2013-04-30 ENCOUNTER — Encounter (HOSPITAL_COMMUNITY): Payer: Self-pay | Admitting: *Deleted

## 2013-04-30 ENCOUNTER — Emergency Department (HOSPITAL_COMMUNITY)
Admission: EM | Admit: 2013-04-30 | Discharge: 2013-04-30 | Disposition: A | Discharge status: Home / Self Care | Payer: Worker's Compensation | Attending: Emergency Medicine | Admitting: Emergency Medicine

## 2013-04-30 DIAGNOSIS — M549 Dorsalgia, unspecified: Secondary | ICD-10-CM

## 2013-04-30 DIAGNOSIS — Z87828 Personal history of other (healed) physical injury and trauma: Secondary | ICD-10-CM | POA: Insufficient documentation

## 2013-04-30 DIAGNOSIS — M542 Cervicalgia: Secondary | ICD-10-CM | POA: Insufficient documentation

## 2013-04-30 DIAGNOSIS — M545 Low back pain, unspecified: Secondary | ICD-10-CM | POA: Insufficient documentation

## 2013-04-30 DIAGNOSIS — Z79899 Other long term (current) drug therapy: Secondary | ICD-10-CM | POA: Insufficient documentation

## 2013-04-30 DIAGNOSIS — Z8669 Personal history of other diseases of the nervous system and sense organs: Secondary | ICD-10-CM | POA: Insufficient documentation

## 2013-04-30 DIAGNOSIS — F17200 Nicotine dependence, unspecified, uncomplicated: Secondary | ICD-10-CM | POA: Insufficient documentation

## 2013-04-30 DIAGNOSIS — Z8659 Personal history of other mental and behavioral disorders: Secondary | ICD-10-CM | POA: Insufficient documentation

## 2013-04-30 MED ORDER — DICLOFENAC SODIUM 75 MG PO TBEC: 75.0000 mg | DELAYED_RELEASE_TABLET | Freq: Two times a day (BID) | ORAL | Status: DC

## 2013-04-30 MED ORDER — METHOCARBAMOL 500 MG PO TABS: 500.0000 mg | ORAL_TABLET | Freq: Three times a day (TID) | ORAL | Status: DC

## 2013-04-30 NOTE — ED Provider Notes (Signed)
CSN: [id number]     Arrival date & time 04/30/13  1533 History   First MD Initiated Contact with Patient 04/30/13 1542     Chief Complaint  Patient presents with  . Back Pain   (Consider location/radiation/quality/duration/timing/severity/associated sxs/prior Treatment) HPI Comments: Patient is a 37 year old male who on or about August 15 sustained a head injury as a result of wood falling on his head at work. The patient had scans done and was evaluated in the emergency department. The patient states he was admitted to the hospital and released to a doctor in [facility]. The doctor in [place] felt that he needed an MRI and he was sent to the [facility] radiology dept. For MRI. The patient states that after this was found to be negative he was released to return to work. The patient states that he feels he may have been released to work too early, he now has increased pain of his back and neck area. The patient states he is also out "Percocet". Patient presents to the emergency department for refill of his Percocet medication.  Patient is a 37 y.o. male presenting with back pain. The history is provided by the patient.  Back Pain Associated symptoms: no abdominal pain, no chest pain and no dysuria     Past Medical History  Diagnosis Date  . Mastoiditis of left side   . Anxiety    Past Surgical History  Procedure Laterality Date  . Mastoidectomy revision    . Othropedic     History reviewed. No pertinent family history. History  Substance Use Topics  . Smoking status: Current Some Day Smoker  . Smokeless tobacco: Not on file  . Alcohol Use: No    Review of Systems  Constitutional: Negative for activity change.       All ROS Neg except as noted in HPI  HENT: Positive for neck pain. Negative for nosebleeds.   Eyes: Negative for photophobia and discharge.  Respiratory: Negative for cough, shortness of breath and wheezing.   Cardiovascular: Negative for chest pain and  palpitations.  Gastrointestinal: Negative for abdominal pain and blood in stool.  Genitourinary: Negative for dysuria, frequency and hematuria.  Musculoskeletal: Positive for back pain. Negative for arthralgias.  Skin: Negative.   Neurological: Negative for dizziness, seizures and speech difficulty.  Psychiatric/Behavioral: Negative for hallucinations and confusion.    Allergies  Review of patient's allergies indicates no known allergies.  Home Medications   Current Outpatient Rx  Name  Route  Sig  Dispense  Refill  . Aspirin-Salicylamide-Caffeine (BC HEADACHE PO)   Oral   Take 1 Package by mouth daily as needed (Occasionally used for pain).          Marland Kitchen esomeprazole (NEXIUM) 40 MG capsule   Oral   Take 40 mg by mouth every other day.          . diclofenac (VOLTAREN) 75 MG EC tablet   Oral   Take 1 tablet (75 mg total) by mouth 2 (two) times daily.   14 tablet   0   . HYDROcodone-acetaminophen (NORCO/VICODIN) 5-325 MG per tablet   Oral   Take 2 tablets by mouth every 4 (four) hours as needed for pain.   10 tablet   0   . ibuprofen (ADVIL,MOTRIN) 800 MG tablet   Oral   Take 1 tablet (800 mg total) by mouth 3 (three) times daily.   21 tablet   0   . methocarbamol (ROBAXIN) 500 MG tablet  Oral   Take 1 tablet (500 mg total) by mouth 3 (three) times daily.   21 tablet   0   . ondansetron (ZOFRAN) 4 MG tablet   Oral   Take 1 tablet (4 mg total) by mouth every 6 (six) hours.   12 tablet   0    BP 112/82  Pulse 107  Temp(Src) 98.7 F (37.1 C) (Oral)  Resp 20  Ht 5\' 11"  (1.803 m)  Wt 210 lb (95.255 kg)  BMI 29.3 kg/m2  SpO2 99% Physical Exam  Nursing note and vitals reviewed. Constitutional: He is oriented to person, place, and time. He appears well-developed and well-nourished.  Non-toxic appearance.  HENT:  Head: Normocephalic.  Right Ear: Tympanic membrane and external ear normal.  Left Ear: Tympanic membrane and external ear normal.  Eyes: EOM and  lids are normal. Pupils are equal, round, and reactive to light.  Neck: Normal range of motion. Neck supple. Carotid bruit is not present.  Mild soreness with range of motion of the neck extending into the shoulders. No palpable step off. No hot areas of the neck area.  Cardiovascular: Normal rate, regular rhythm, normal heart sounds, intact distal pulses and normal pulses.   Pulmonary/Chest: Breath sounds normal. No respiratory distress.  Abdominal: Soft. Bowel sounds are normal. There is no tenderness. There is no guarding.  Musculoskeletal: Normal range of motion.  No palpable step off of the cervical spine area. No hot areas appreciated.  Pain of the lumbar area with range of motion and with change of position. No palpable step off appreciated. No hot areas appreciated.  Lymphadenopathy:       Head (right side): No submandibular adenopathy present.       Head (left side): No submandibular adenopathy present.    He has no cervical adenopathy.  Neurological: He is alert and oriented to person, place, and time. He has normal strength. No cranial nerve deficit or sensory deficit.  No gross neurologic deficits appreciated on examination. Gait is steady and intact.  Skin: Skin is warm and dry.  Psychiatric: He has a normal mood and affect. His speech is normal.    ED Course  Procedures (including critical care time) Labs Review Labs Reviewed - No data to display Imaging Review No results found.  MDM   1. Back pain    **I have reviewed nursing notes, vital signs, and all appropriate lab and imaging results for this patient.*  Patient presents to the emergency department primarily for refill of his Percocet because he states that this" really helped his pain and allow him to work". The patient also states that he thinks he may have gone back to work too early, and he is now out of his medication and having discomfort. Review of the scans as well as the emergency department notes have  been completed by me.  No gross neurologic deficits appreciated on tonight's examination. Vital signs stable with the exception of the pulse rate being elevated at 107. The pulse oximetry is 99% on room air, within normal limits by my interpretation.  I explained to the patient that he would need to see his primary physician, or with compensation physician concerning his return to work duty. I have given the patient the name of Dr. Claretha Cooper management physician. Prescription given for Robaxin 3 times a day, and diclofenac twice a day. I discussed with the patient in detail that the emergency department is not set up for this type of pain management. Patient  acknowledges understanding of this explanation.  Kathie Dike, PA-C 04/30/13 [phone]

## 2013-04-30 NOTE — ED Notes (Signed)
Head injury 8/15, seen at [facility]  And had ct done here Neck and back pain.

## 2013-04-30 NOTE — ED Provider Notes (Signed)
Medical screening examination/treatment/procedure(s) were performed by non-physician practitioner and as supervising physician I was immediately available for consultation/collaboration.  Emanuela Runnion, MD 04/30/13 1922 

## 2013-05-05 ENCOUNTER — Ambulatory Visit: Payer: Worker's Compensation | Attending: Family Medicine | Admitting: Physical Therapy

## 2013-05-05 DIAGNOSIS — R5381 Other malaise: Secondary | ICD-10-CM | POA: Insufficient documentation

## 2013-05-05 DIAGNOSIS — M546 Pain in thoracic spine: Secondary | ICD-10-CM | POA: Insufficient documentation

## 2013-05-05 DIAGNOSIS — R293 Abnormal posture: Secondary | ICD-10-CM | POA: Insufficient documentation

## 2013-05-05 DIAGNOSIS — M542 Cervicalgia: Secondary | ICD-10-CM | POA: Insufficient documentation

## 2013-05-05 DIAGNOSIS — IMO0001 Reserved for inherently not codable concepts without codable children: Secondary | ICD-10-CM | POA: Insufficient documentation

## 2013-05-06 ENCOUNTER — Ambulatory Visit: Payer: Worker's Compensation

## 2013-05-08 ENCOUNTER — Ambulatory Visit: Payer: Worker's Compensation

## 2013-05-15 ENCOUNTER — Telehealth: Payer: Self-pay | Admitting: Nurse Practitioner

## 2013-05-15 NOTE — Telephone Encounter (Signed)
Sorry but needs to be seen for pain meds

## 2013-05-18 NOTE — Telephone Encounter (Signed)
Patient aware.

## 2013-05-19 ENCOUNTER — Encounter (HOSPITAL_COMMUNITY): Payer: Self-pay

## 2013-05-19 ENCOUNTER — Emergency Department (HOSPITAL_COMMUNITY)
Admission: EM | Admit: 2013-05-19 | Discharge: 2013-05-19 | Disposition: A | Discharge status: Home / Self Care | Payer: Self-pay | Attending: Emergency Medicine | Admitting: Emergency Medicine

## 2013-05-19 DIAGNOSIS — Z8659 Personal history of other mental and behavioral disorders: Secondary | ICD-10-CM | POA: Insufficient documentation

## 2013-05-19 DIAGNOSIS — F17200 Nicotine dependence, unspecified, uncomplicated: Secondary | ICD-10-CM | POA: Insufficient documentation

## 2013-05-19 DIAGNOSIS — Z8669 Personal history of other diseases of the nervous system and sense organs: Secondary | ICD-10-CM | POA: Insufficient documentation

## 2013-05-19 DIAGNOSIS — S060X1A Concussion with loss of consciousness of 30 minutes or less, initial encounter: Secondary | ICD-10-CM | POA: Insufficient documentation

## 2013-05-19 DIAGNOSIS — Z79899 Other long term (current) drug therapy: Secondary | ICD-10-CM | POA: Insufficient documentation

## 2013-05-19 DIAGNOSIS — T07XXXA Unspecified multiple injuries, initial encounter: Secondary | ICD-10-CM | POA: Insufficient documentation
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
## 2013-07-09 ENCOUNTER — Other Ambulatory Visit: Payer: Self-pay | Admitting: Nurse Practitioner

## 2014-01-04 ENCOUNTER — Emergency Department (HOSPITAL_COMMUNITY)
Admission: EM | Admit: 2014-01-04 | Discharge: 2014-01-04 | Disposition: A | Discharge status: Home / Self Care | Payer: Self-pay | Attending: Emergency Medicine | Admitting: Emergency Medicine

## 2014-01-04 ENCOUNTER — Encounter (HOSPITAL_COMMUNITY): Payer: Self-pay | Admitting: Emergency Medicine

## 2014-01-04 DIAGNOSIS — F411 Generalized anxiety disorder: Secondary | ICD-10-CM | POA: Insufficient documentation

## 2014-01-04 DIAGNOSIS — F419 Anxiety disorder, unspecified: Secondary | ICD-10-CM

## 2014-01-04 DIAGNOSIS — Z7982 Long term (current) use of aspirin: Secondary | ICD-10-CM | POA: Insufficient documentation

## 2014-01-04 DIAGNOSIS — R109 Unspecified abdominal pain: Secondary | ICD-10-CM | POA: Insufficient documentation

## 2014-01-04 DIAGNOSIS — R6883 Chills (without fever): Secondary | ICD-10-CM | POA: Insufficient documentation

## 2014-01-04 DIAGNOSIS — Z87891 Personal history of nicotine dependence: Secondary | ICD-10-CM | POA: Insufficient documentation

## 2014-01-04 DIAGNOSIS — Z8669 Personal history of other diseases of the nervous system and sense organs: Secondary | ICD-10-CM | POA: Insufficient documentation

## 2014-01-04 DIAGNOSIS — Z79899 Other long term (current) drug therapy: Secondary | ICD-10-CM | POA: Insufficient documentation

## 2014-01-04 DIAGNOSIS — R51 Headache: Secondary | ICD-10-CM | POA: Insufficient documentation

## 2014-01-04 MED ORDER — LORAZEPAM 1 MG PO TABS: 1.0000 mg | ORAL_TABLET | Freq: Three times a day (TID) | ORAL | Status: DC | PRN

## 2014-01-04 MED ORDER — PANTOPRAZOLE SODIUM 40 MG PO TBEC
40.0000 mg | DELAYED_RELEASE_TABLET | Freq: Once | ORAL | Status: AC
  Administered 2014-01-04: 40 mg via ORAL
  Filled 2014-01-04: qty 1

## 2014-01-04 MED ORDER — LORAZEPAM 1 MG PO TABS
1.0000 mg | ORAL_TABLET | Freq: Once | ORAL | Status: AC
  Administered 2014-01-04: 1 mg via ORAL
  Filled 2014-01-04: qty 1

## 2014-01-04 MED ORDER — PANTOPRAZOLE SODIUM 20 MG PO TBEC: 20.0000 mg | DELAYED_RELEASE_TABLET | Freq: Every day | ORAL | Status: DC

## 2014-01-04 NOTE — ED Notes (Signed)
Patient states psychiatrist changed his medications; states has not taken his medication in a few days.

## 2014-01-04 NOTE — Discharge Instructions (Signed)
Panic Attacks Panic attacks are sudden, short feelings of great fear or discomfort. You may have them for no reason when you are relaxed, when you are uneasy (anxious), or when you are sleeping.  HOME CARE  Take all your medicines as told.  Check with your doctor before starting new medicines.  Keep all doctor visits. GET HELP IF:  You are not able to take your medicines as told.  Your symptoms do not get better.  Your symptoms get worse. GET HELP RIGHT AWAY IF:  Your attacks seem different than your normal attacks.  You have thoughts about hurting yourself or others.  You take panic attack medicine and you have a side effect. MAKE SURE YOU:  Understand these instructions.  Will watch your condition.  Will get help right away if you are not doing well or get worse. Document Released: 09/15/2010 Document Revised: 06/03/2013 Document Reviewed: 03/27/2013 [facility] Patient Information 2014 [place].  Take the Ativan as directed. Followup with your psychiatrist for additional medication. Take the Protonix as directed. Return for any newer worse symptoms.

## 2014-01-04 NOTE — ED Notes (Signed)
Pt was taking Klonopin and changed his medication 1 1/2 months ago and new medicine isn't helping with his anxiety.

## 2014-01-04 NOTE — ED Provider Notes (Addendum)
CSN: [id number]     Arrival date & time 01/04/14  0008 History  This chart was scribed for Shelda JakesScott W. Malvern Kadlec, MD by Danella Maiersaroline Early, ED Scribe. This patient was seen in room APA10/APA10 and the patient's care was started at 12:14 AM.    Chief Complaint  Patient presents with  . Anxiety   Patient is a 38 y.o. male presenting with anxiety. The history is provided by the patient. No language interpreter was used.  Anxiety This is a chronic problem. The current episode started more than 1 week ago. The problem occurs constantly. The problem has been gradually worsening. Associated symptoms include abdominal pain (burning, since taking benadryl tonight) and headaches. Pertinent negatives include no chest pain and no shortness of breath.   HPI Comments: Michael Price is a 38 y.o. male who presents to the Emergency Department complaining of anxiety for the past 15 days since coming off of Klonopin that worsened when he took Benadryl tonight. He states he was on Klonopin for years until he switched from PCP to psychiatrist, and the psychiatrist took him off Klonopin and put him on gabapentin and hydroxyzine. He states he stopped the Klonopin cold [place] instead of tapering off. He states he thinks the benadryl worsened his panic-like symptoms. He denies SI or HI.   Past Medical History  Diagnosis Date  . Mastoiditis of left side   . Anxiety    Past Surgical History  Procedure Laterality Date  . Mastoidectomy revision    . Othropedic    . Arm surgery     No family history on file. History  Substance Use Topics  . Smoking status: Former Games developermoker  . Smokeless tobacco: Not on file  . Alcohol Use: No    Review of Systems  Constitutional: Positive for chills. Negative for fever.  HENT: Negative for rhinorrhea and sore throat.   Eyes: Negative for visual disturbance.  Respiratory: Negative for cough and shortness of breath.   Cardiovascular: Negative for chest pain and leg swelling.   Gastrointestinal: Positive for abdominal pain (burning, since taking benadryl tonight). Negative for nausea, vomiting and diarrhea.  Genitourinary: Negative for dysuria and hematuria.  Musculoskeletal: Negative for back pain and neck pain.  Skin: Negative for rash.  Neurological: Positive for headaches.  Hematological: Does not bruise/bleed easily.  Psychiatric/Behavioral: Negative for suicidal ideas and confusion. The patient is nervous/anxious.       Allergies  Review of patient's allergies indicates no known allergies.  Home Medications   Prior to Admission medications   Medication Sig Start Date End Date Taking? Authorizing Provider  Aspirin-Salicylamide-Caffeine (BC HEADACHE PO) Take 1 Package by mouth daily as needed (Occasionally used for pain).     Historical Provider, MD  esomeprazole (NEXIUM) 40 MG capsule Take 40 mg by mouth every other day.     Historical Provider, MD   BP 125/94  Pulse 109  Temp(Src) 98.9 F (37.2 C) (Oral)  Resp 22  Ht 5\' 11"  (1.803 m)  Wt 230 lb (104.327 kg)  BMI 32.09 kg/m2  SpO2 99% Physical Exam  Nursing note and vitals reviewed. Constitutional: He is oriented to person, place, and time. He appears well-developed and well-nourished. No distress.  HENT:  Head: Normocephalic and atraumatic.  Mouth/Throat: Oropharynx is clear and moist.  No swelling of the lips or tongue  Eyes: EOM are normal.  Neck: Neck supple. No tracheal deviation present.  Cardiovascular: Normal rate and regular rhythm.   Pulmonary/Chest: Effort normal and breath sounds normal.  No respiratory distress. He has no wheezes.  Abdominal: Bowel sounds are normal. There is no tenderness.  Musculoskeletal: Normal range of motion. He exhibits no edema.  Neurological: He is alert and oriented to person, place, and time.  Skin: Skin is warm and dry.  Psychiatric: He has a normal mood and affect. His behavior is normal.    ED Course  Procedures (including critical care  time) Medications  pantoprazole (PROTONIX) EC tablet 40 mg (not administered)  LORazepam (ATIVAN) tablet 1 mg (not administered)    DIAGNOSTIC STUDIES: Oxygen Saturation is 99% on RA, normal by my interpretation.    COORDINATION OF CARE: 12:41 AM- Discussed treatment plan with pt. Pt agrees to plan.    Labs Review Labs Reviewed - No data to display  Imaging Review No results found.   EKG Interpretation None      MDM   Final diagnoses:  Anxiety    The patient seems to have a long-standing history of anxiety. Other symptoms that occurred tonight seemed to be related to an allergic reaction. Patient felt better once the Benadryl wore off it may have been exacerbating his symptoms. Patient had been on Protonix in the past but run out so we'll renew that. We'll give him a very short course of Ativan including a dose here tonight and then the rest of the treatment for the anxiety will be according to his psychiatrist.  Patient denies any suicidal or homicidal ideations.  Patient's medication list applies Nexium in the past but he denied purple pill onset was Protonix. So we'll renew that.     I personally performed the services described in this documentation, which was scribed in my presence. The recorded information has been reviewed and is accurate.    Shelda JakesScott W. Piera Downs, MD 01/04/14 [id number]  Shelda JakesScott W. Zarius Furr, MD 01/04/14 [phone]

## 2014-04-17 ENCOUNTER — Emergency Department (HOSPITAL_COMMUNITY)
Admission: EM | Admit: 2014-04-17 | Discharge: 2014-04-17 | Disposition: A | Discharge status: Home / Self Care | Payer: Managed Care, Other (non HMO) | Attending: Emergency Medicine | Admitting: Emergency Medicine

## 2014-04-17 ENCOUNTER — Encounter (HOSPITAL_COMMUNITY): Payer: Self-pay | Admitting: Emergency Medicine

## 2014-04-17 DIAGNOSIS — Z8659 Personal history of other mental and behavioral disorders: Secondary | ICD-10-CM | POA: Insufficient documentation

## 2014-04-17 DIAGNOSIS — Z87891 Personal history of nicotine dependence: Secondary | ICD-10-CM | POA: Insufficient documentation

## 2014-04-17 DIAGNOSIS — H6092 Unspecified otitis externa, left ear: Secondary | ICD-10-CM

## 2014-04-17 DIAGNOSIS — H60399 Other infective otitis externa, unspecified ear: Secondary | ICD-10-CM | POA: Insufficient documentation

## 2014-04-17 DIAGNOSIS — Z79899 Other long term (current) drug therapy: Secondary | ICD-10-CM | POA: Insufficient documentation

## 2014-04-17 DIAGNOSIS — H9209 Otalgia, unspecified ear: Secondary | ICD-10-CM | POA: Insufficient documentation

## 2014-04-17 MED ORDER — OXYCODONE-ACETAMINOPHEN 5-325 MG PO TABS
1.0000 | ORAL_TABLET | Freq: Once | ORAL | Status: AC
  Administered 2014-04-17: 1 via ORAL
  Filled 2014-04-17: qty 1

## 2014-04-17 MED ORDER — AMOXICILLIN-POT CLAVULANATE 875-125 MG PO TABS
1.0000 | ORAL_TABLET | Freq: Once | ORAL | Status: AC
  Administered 2014-04-17: 1 via ORAL
  Filled 2014-04-17: qty 1

## 2014-04-17 MED ORDER — CIPROFLOXACIN-DEXAMETHASONE 0.3-0.1 % OT SUSP
4.0000 [drp] | Freq: Two times a day (BID) | OTIC | Status: AC
Start: 2014-04-17 — End: 2014-04-24

## 2014-04-17 MED ORDER — NAPROXEN 500 MG PO TABS: 500.0000 mg | ORAL_TABLET | Freq: Two times a day (BID) | ORAL | Status: DC

## 2014-04-17 MED ORDER — ANTIPYRINE-BENZOCAINE 5.4-1.4 % OT SOLN
3.0000 [drp] | Freq: Once | OTIC | Status: AC
  Administered 2014-04-17: 4 [drp] via OTIC
  Filled 2014-04-17: qty 10

## 2014-04-17 MED ORDER — CIPROFLOXACIN-DEXAMETHASONE 0.3-0.1 % OT SUSP
4.0000 [drp] | Freq: Two times a day (BID) | OTIC | Status: DC
  Filled 2014-04-17: qty 7.5

## 2014-04-17 MED ORDER — AMOXICILLIN-POT CLAVULANATE 875-125 MG PO TABS: 1.0000 | ORAL_TABLET | Freq: Two times a day (BID) | ORAL | Status: DC

## 2014-04-17 NOTE — ED Notes (Signed)
Pt reporting pain in left ear for past two weeks.

## 2014-04-17 NOTE — Discharge Instructions (Signed)

## 2014-04-17 NOTE — ED Provider Notes (Signed)
CSN: [id number]     Arrival date & time 04/17/14  0235 History   First MD Initiated Contact with Patient 04/17/14 0249     Chief Complaint  Patient presents with  . Otalgia      HPI Patient states he has a history of mastoiditis on the left side the past.  He reports increasing left ear discomfort and drainage from his left ear over the past 4-5 days.  His pain initially began approximately 2 weeks ago.  He states his pain is moderate to severe this time.  He denies fevers and chills.  He has not called his ear nose and throat surgeon.  Denies neck pain.  No headache.  No dental pain.  No other complaints   Past Medical History  Diagnosis Date  . Mastoiditis of left side   . Anxiety    Past Surgical History  Procedure Laterality Date  . Mastoidectomy revision    . Othropedic    . Arm surgery     History reviewed. No pertinent family history. History  Substance Use Topics  . Smoking status: Former Games developer  . Smokeless tobacco: Not on file  . Alcohol Use: Yes     Comment: occasional    Review of Systems  All other systems reviewed and are negative.     Allergies  Review of patient's allergies indicates no known allergies.  Home Medications   Prior to Admission medications   Medication Sig Start Date End Date Taking? Authorizing Provider         Aspirin-Salicylamide-Caffeine (BC HEADACHE PO) Take 1 Package by mouth daily as needed (Occasionally used for pain).     Historical Provider, MD         esomeprazole (NEXIUM) 40 MG capsule Take 40 mg by mouth every other day.     Historical Provider, MD  LORazepam (ATIVAN) 1 MG tablet Take 1 tablet (1 mg total) by mouth 3 (three) times daily as needed for anxiety. 01/04/14   Vanetta Mulders, MD         pantoprazole (PROTONIX) 20 MG tablet Take 1 tablet (20 mg total) by mouth daily. 01/04/14   Vanetta Mulders, MD   BP 117/94  Pulse 83  Temp(Src) 98.2 F (36.8 C) (Oral)  Resp 20  Ht 6' (1.829 m)  Wt 230 lb (104.327 kg)   BMI 31.19 kg/m2  SpO2 99% Physical Exam  Nursing note and vitals reviewed. Constitutional: He is oriented to person, place, and time. He appears well-developed and well-nourished.  HENT:  Head: Normocephalic and atraumatic.  Right TM normal.  Left TM slightly full.  No left mastoid tenderness.  Swelling of his left external auditory canal with some drainage present.  No significant swelling to preclude otic drops.  Eyes: EOM are normal.  Neck: Normal range of motion.  Cardiovascular: Normal rate, regular rhythm, normal heart sounds and intact distal pulses.   Pulmonary/Chest: Effort normal and breath sounds normal. No respiratory distress.  Abdominal: Soft. He exhibits no distension. There is no tenderness.  Musculoskeletal: Normal range of motion.  Neurological: He is alert and oriented to person, place, and time.  Skin: Skin is warm and dry.  Psychiatric: He has a normal mood and affect. Judgment normal.    ED Course  Procedures (including critical care time) Labs Review Labs Reviewed - No data to display  Imaging Review No results found.   EKG Interpretation None      MDM   Final diagnoses:  Otitis externa of left  ear    Left otitis externa.  Will start on Ciprodex.  Given his history of mastoiditis in his pain present for approximately 2 weeks he'll also be started on Augmentin.  Avastin to call his ear nose and throat surgeon for followup on Monday.    Lyanne CoKevin M Clarence Dunsmore, MD 04/17/14 [phone]

## 2014-04-26 ENCOUNTER — Emergency Department (HOSPITAL_BASED_OUTPATIENT_CLINIC_OR_DEPARTMENT_OTHER): Payer: Managed Care, Other (non HMO)

## 2014-04-26 ENCOUNTER — Emergency Department (HOSPITAL_BASED_OUTPATIENT_CLINIC_OR_DEPARTMENT_OTHER)
Admission: EM | Admit: 2014-04-26 | Discharge: 2014-04-26 | Disposition: A | Discharge status: Home / Self Care | Payer: Managed Care, Other (non HMO) | Attending: Emergency Medicine | Admitting: Emergency Medicine

## 2014-04-26 ENCOUNTER — Encounter (HOSPITAL_BASED_OUTPATIENT_CLINIC_OR_DEPARTMENT_OTHER): Payer: Self-pay | Admitting: Emergency Medicine

## 2014-04-26 DIAGNOSIS — F411 Generalized anxiety disorder: Secondary | ICD-10-CM | POA: Insufficient documentation

## 2014-04-26 DIAGNOSIS — Z87891 Personal history of nicotine dependence: Secondary | ICD-10-CM | POA: Insufficient documentation

## 2014-04-26 DIAGNOSIS — Z79899 Other long term (current) drug therapy: Secondary | ICD-10-CM | POA: Insufficient documentation

## 2014-04-26 DIAGNOSIS — H7012 Chronic mastoiditis, left ear: Secondary | ICD-10-CM

## 2014-04-26 DIAGNOSIS — H7010 Chronic mastoiditis, unspecified ear: Secondary | ICD-10-CM | POA: Insufficient documentation

## 2014-04-26 DIAGNOSIS — Z9889 Other specified postprocedural states: Secondary | ICD-10-CM | POA: Insufficient documentation

## 2014-04-26 DIAGNOSIS — H9209 Otalgia, unspecified ear: Secondary | ICD-10-CM | POA: Insufficient documentation

## 2014-04-26 MED ORDER — AMOXICILLIN-POT CLAVULANATE 875-125 MG PO TABS: 1.0000 | ORAL_TABLET | Freq: Two times a day (BID) | ORAL | Status: DC

## 2014-04-26 MED ORDER — HYDROCODONE-ACETAMINOPHEN 5-325 MG PO TABS: 1.0000 | ORAL_TABLET | Freq: Four times a day (QID) | ORAL | Status: DC | PRN

## 2014-04-26 MED ORDER — AMOXICILLIN-POT CLAVULANATE 875-125 MG PO TABS
1.0000 | ORAL_TABLET | Freq: Once | ORAL | Status: AC
  Administered 2014-04-26: 1 via ORAL
  Filled 2014-04-26: qty 1

## 2014-04-26 NOTE — Discharge Instructions (Signed)
Mastoiditis °Mastoiditis is an infection that has spread from the middle ear to a bony area (the mastoid air cells) behind the middle ear. It is an uncommon complication of a middle ear infection. It occurs most often in young children. Treatment with the right antibiotics (medications that are used to treat bacteria germs) is generally effective. With the right treatment, there is a very high chance of full recovery.  °SYMPTOMS  °Some common symptoms of mastoiditis include:  °· Pain, swelling, redness, warmth, or a tender mass of the bone behind the ear. °· Fever. °· Fussiness and irritability. °· Redness and swelling of the ear lobe or ear. °· Ear drainage. °· Headache. °DIAGNOSIS  °Your caregiver will make the diagnosis based on an exam and on questions about what you or your child has been feeling. Other tests that may be done include: °· Blood work. °· Blood cultures or cultures of ear drainage. °· X-rays. °· If you or your child has symptoms that suggest more serious problems, additional tests and/or specialized x-rays may be done. These could include: °¨ CT (CAT) scans. °¨ Magnetic Resonance Imaging (MRI). °TREATMENT  °Treatment will be based on how serious the infection is and what will be expected to give the best outcome. The treatment required may be: °· Hospitalization and antibiotics given through a vein. °· An operation (myringotomy) is sometimes done to relieve the pressure from the middle ear. This is a surgical procedure where a small hole is cut into the ear drum. A small tube is then placed to keep the hole open and draining. The tubes usually fall out on their own after 6 to 12 months. °· In rare cases, if the above treatments do not work, more surgery may be necessary. This is called a mastoidectomy. °RISKS AND COMPLICATIONS °These are rare if proper treatment is started early. These can include: °· Facial paralysis °· Infection and possible destruction of the mastoid bone. °· Spread of  infection to the neck. °· Hearing loss which can be partial or complete on the side of the infection. °· Infection spread to the brain. °· Clots or blockage of blood vessels in the neck or brain. °HOME CARE INSTRUCTIONS  °· Take prescribed antibiotics and other medications as directed by your caregiver. °· Follow-up with an exam by an ENT (Ear, Nose, and Throat) specialist if recommended. °· A follow-up hearing test (audiogram) may be recommended. °SEEK MEDICAL CARE IF:  °· You develop recurrent fevers of 100° F (37.8° C) or higher. °· You develop new headache, ear, or facial pain that had not happened before. °· You feel that there has been loss of hearing. °SEEK IMMEDIATE MEDICAL CARE IF:  °· You develop fevers of 102° F (38.9° C). °· You develop severe headache, ear, or facial pain. °· You experience sudden hearing loss. °· You develop repeated episodes of vomiting. °· You develop weakness or drooping of one side of your face. °· You experience weakness of one arm, one leg, or on one side of your body. °· You develop sudden problems with speech and/or vision. °MAKE SURE YOU:  °· Understand these instructions. °· Will watch your condition. °· Will get help right away if you are not doing well or get worse. °Document Released: 09/12/2006 Document Revised: 11/05/2011 Document Reviewed: 08/01/2007 °ExitCare® Patient Information ©2015 [employer]. This information is not intended to replace advice given to you by your health care provider. Make sure you discuss any questions you have with your health care provider. ° °

## 2014-04-26 NOTE — ED Notes (Signed)
Reports pain in left ear. Sts drainage and increased pain.

## 2014-04-26 NOTE — ED Provider Notes (Signed)
CSN: [id number]     Arrival date & time 04/26/14  0001 History   First MD Initiated Contact with Patient 04/26/14 0133     Chief Complaint  Patient presents with  . Ear Pain      (Consider location/radiation/quality/duration/timing/severity/associated sxs/prior Treatment) HPI This is a 38 year old male with a history of left mastoidectomy for mastoiditis. He states he is here with 3 weeks of severe pain in his left ear accompanied by drainage of fluid and blood. Pain is worse in the mornings. He has been taking over-the-counter analgesics without relief. He states that they have given him heartburn and acid reflux. He did not mention, but it is noted in the medical record, that he was seen on August 22 at which time he stated the pain had been present for 2 weeks. He was treated with Ciprodex chronic and Augmentin. He has not followed up with his ear nose throat surgeon Dr. Jena Gauss. He denies fever, chills, lymphadenopathy, nausea or vomiting.  Past Medical History  Diagnosis Date  . Mastoiditis of left side   . Anxiety    Past Surgical History  Procedure Laterality Date  . Mastoidectomy revision    . Othropedic    . Arm surgery     No family history on file. History  Substance Use Topics  . Smoking status: Former Games developer  . Smokeless tobacco: Not on file  . Alcohol Use: Yes     Comment: occasional    Review of Systems  All other systems reviewed and are negative.   Allergies  Review of patient's allergies indicates no known allergies.  Home Medications   Prior to Admission medications   Medication Sig Start Date End Date Taking? Authorizing Provider  amoxicillin-clavulanate (AUGMENTIN) 875-125 MG per tablet Take 1 tablet by mouth 2 (two) times daily. 04/17/14   Lyanne Co, MD  Aspirin-Salicylamide-Caffeine [facility] HEADACHE PO) Take 1 Package by mouth daily as needed (Occasionally used for pain).     Historical Provider, MD  esomeprazole (NEXIUM) 40 MG capsule Take 40 mg  by mouth every other day.     Historical Provider, MD  LORazepam (ATIVAN) 1 MG tablet Take 1 tablet (1 mg total) by mouth 3 (three) times daily as needed for anxiety. 01/04/14   Vanetta Mulders, MD  naproxen (NAPROSYN) 500 MG tablet Take 1 tablet (500 mg total) by mouth 2 (two) times daily. 04/17/14   Lyanne Co, MD  pantoprazole (PROTONIX) 20 MG tablet Take 1 tablet (20 mg total) by mouth daily. 01/04/14   Vanetta Mulders, MD   BP 134/89  Pulse 105  Temp(Src) 97.9 F (36.6 C) (Oral)  Resp 18  Ht 6' (1.829 m)  Wt 232 lb (105.235 kg)  BMI 31.46 kg/m2  SpO2 99%  Physical Exam General: Well-developed, well-nourished male in no acute distress; appearance consistent with age of record HENT: normocephalic; atraumatic; normal oropharynx; right TM normal; right external auditory canal normal; left external auditory canal edematous with exudate and tenderness on movement of left external ear; left TM partially obscured but visible part appears normal; there is tenderness at the location of prior mastoidectomy without edema or erythema Eyes: pupils equal, round and reactive to light; extraocular muscles intact Neck: supple; no lymphadenopathy Heart: regular rate and rhythm Lungs: clear to auscultation bilaterally Abdomen: soft; nondistended Extremities: No deformity; full range of motion Neurologic: Awake, alert; motor function intact in all extremities and symmetric; no facial droop Skin: Warm and dry Psychiatric: Flat affect; circumstantial speech  ED Course  Procedures (including critical care time)   Nursing notes and vitals signs, including pulse oximetry, reviewed.  Summary of this visit's results, reviewed by myself:  Labs:  No results found for this or any previous visit (from the past 24 hour(s)).  Imaging Studies: Ct Temporal Bones W/o Cm  04/26/2014   EXAM: CT TEMPORAL BONES WITHOUT CONTRAST  TECHNIQUE: Axial and coronal plane CT imaging of the petrous temporal bones  was performed with thin-collimation image reconstruction. No intravenous contrast was administered. Multiplanar CT image reconstructions were also generated.  COMPARISON:  None.  FINDINGS: Right temporal bone: The pina and external auditory canal are unremarkable. The tympanic membrane is thin. The ossicles are normally formed and aligned. Mastoid and middle ear is well pneumatized and aerated. The vestibular structures appear normally formed and bony covered. Internal auditory canal is normal in size. The vestibular aqueduct is normal in size. The carotid and sigmoid sinus are bone covered. Unremarkable facial nerve canal.  Left temporal bone:  The pina is unremarkable. There is soft tissue density within the left EAC which appears partially adherent to the left tympanic membrane. This may reflect retained cerumen/ debris. The left tympanic membrane itself is relatively thin and normal in appearance.  Postoperative changes from prior probable canal wall down mastoidectomy seen. Soft tissue density seen within the mastoid bowl, with opacification of the few remaining mastoid air cells.  There is soft tissue density within the epitympanum and Prussak's space. The scutum is eroded. The ossicles are not well visualized and are likely eroded, with only a portion of the long process of the incus and stapes footplate visualized. The manubrium is largely absent. There is question of faint dehiscence of the tegmen tympani.  The vestibular structures appear normally formed and bony covered. Internal auditory canal is normal in size. The vestibular aqueduct is normal in size. The carotid and sigmoid sinus are bone covered. Unremarkable facial nerve canal.  IMPRESSION: 1. Postoperative changes from prior canal wall down mastoidectomy with soft tissue density present within the mastoid bowl and epitympanum of the left middle ear cavity. There is advanced ossicular erosion within the left middle ear with soft tissue density in  Prussak's space and question of dehiscence of the tegmen tympani. 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 2. Soft tissue opacity within the left external auditory canal, likely retained debris/cerumen. 3. Normal right temporal bone CT parent   Electronically Signed   By: Rise Mu M.D.   On: 04/26/2014 02:40   Patient advised of CT findings. Will extend antibiotic coverage and refer to ENT.       Carlisle Beers Setareh Rom, MD 04/26/14 0300

## 2014-04-26 NOTE — ED Notes (Signed)
Pt has blood in ear canal. Pt unwilling to let this RN look in ear.

## 2014-10-23 ENCOUNTER — Encounter (HOSPITAL_COMMUNITY): Payer: Self-pay | Admitting: Emergency Medicine

## 2014-10-23 ENCOUNTER — Emergency Department (HOSPITAL_COMMUNITY)
Admission: EM | Admit: 2014-10-23 | Discharge: 2014-10-23 | Disposition: A | Discharge status: Home / Self Care | Payer: 59 | Attending: Emergency Medicine | Admitting: Emergency Medicine

## 2014-10-23 ENCOUNTER — Emergency Department (HOSPITAL_COMMUNITY): Payer: 59

## 2014-10-23 DIAGNOSIS — Z7982 Long term (current) use of aspirin: Secondary | ICD-10-CM | POA: Insufficient documentation

## 2014-10-23 DIAGNOSIS — Y9389 Activity, other specified: Secondary | ICD-10-CM | POA: Diagnosis not present

## 2014-10-23 DIAGNOSIS — M25512 Pain in left shoulder: Secondary | ICD-10-CM

## 2014-10-23 DIAGNOSIS — Y998 Other external cause status: Secondary | ICD-10-CM | POA: Diagnosis not present

## 2014-10-23 DIAGNOSIS — W228XXA Striking against or struck by other objects, initial encounter: Secondary | ICD-10-CM | POA: Diagnosis not present

## 2014-10-23 DIAGNOSIS — Z8669 Personal history of other diseases of the nervous system and sense organs: Secondary | ICD-10-CM | POA: Diagnosis not present

## 2014-10-23 DIAGNOSIS — Z79899 Other long term (current) drug therapy: Secondary | ICD-10-CM | POA: Insufficient documentation

## 2014-10-23 DIAGNOSIS — S4992XA Unspecified injury of left shoulder and upper arm, initial encounter: Secondary | ICD-10-CM | POA: Insufficient documentation

## 2014-10-23 DIAGNOSIS — Y9289 Other specified places as the place of occurrence of the external cause: Secondary | ICD-10-CM | POA: Insufficient documentation

## 2014-10-23 DIAGNOSIS — Z720 Tobacco use: Secondary | ICD-10-CM | POA: Insufficient documentation

## 2014-10-23 DIAGNOSIS — Z792 Long term (current) use of antibiotics: Secondary | ICD-10-CM | POA: Diagnosis not present

## 2014-10-23 DIAGNOSIS — F419 Anxiety disorder, unspecified: Secondary | ICD-10-CM | POA: Diagnosis not present

## 2014-10-23 MED ORDER — NAPROXEN 500 MG PO TABS: 500.0000 mg | ORAL_TABLET | Freq: Two times a day (BID) | ORAL | Status: DC

## 2014-10-23 MED ORDER — OXYCODONE-ACETAMINOPHEN 5-325 MG PO TABS
2.0000 | ORAL_TABLET | Freq: Once | ORAL | Status: AC
Start: 2014-10-23 — End: 2014-10-23
  Administered 2014-10-23: 2 via ORAL
  Filled 2014-10-23: qty 2

## 2014-10-23 MED ORDER — OXYCODONE-ACETAMINOPHEN 5-325 MG PO TABS: 1.0000 | ORAL_TABLET | ORAL | Status: DC | PRN

## 2014-10-23 NOTE — ED Notes (Signed)
Pt verbalized understanding of no driving and to use caution within 4 hours of taking pain meds due to meds cause drowsiness 

## 2014-10-23 NOTE — Discharge Instructions (Signed)
Shoulder Pain  The shoulder is the joint that connects your arm to your body. Muscles and band-like tissues that connect bones to muscles (tendons) hold the joint together. Shoulder pain is felt if an injury or medical problem affects one or more parts of the shoulder.  HOME CARE   · Put ice on the sore area.  ¨ Put ice in a plastic bag.  ¨ Place a towel between your skin and the bag.  ¨ Leave the ice on for 15-20 minutes, 03-04 times a day for the first 2 days.  · Stop using cold packs if they do not help with the pain.  · If you were given something to keep your shoulder from moving (sling; shoulder immobilizer), wear it as told. Only take it off to shower or bathe.  · Move your arm as little as possible, but keep your hand moving to prevent puffiness (swelling).  · Squeeze a soft ball or foam pad as much as possible to help prevent swelling.  · Take medicine as told by your doctor.  GET HELP IF:  · You have progressing new pain in your arm, hand, or fingers.  · Your hand or fingers get cold.  · Your medicine does not help lessen your pain.  GET HELP RIGHT AWAY IF:   · Your arm, hand, or fingers are numb or tingling.  · Your arm, hand, or fingers are puffy (swollen), painful, or turn white or blue.  MAKE SURE YOU:   · Understand these instructions.  · Will watch your condition.  · Will get help right away if you are not doing well or get worse.  Document Released: 01/30/2008 Document Revised: 12/28/2013 Document Reviewed: 02/25/2012  ExitCare® Patient Information ©2015 [employer]. This information is not intended to replace advice given to you by your health care provider. Make sure you discuss any questions you have with your health care provider.
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
## 2014-10-25 ENCOUNTER — Telehealth: Payer: Self-pay | Admitting: Orthopedic Surgery

## 2014-10-25 NOTE — Telephone Encounter (Signed)
[facility]; requests appointment; discussed appointment, for which a referral is required from primary care provider [facility]; states he is in midst of being approved by Dr Lysbeth GalasNyland; appointment pending.  Ph#'s [phone] or alternate# [phone].

## 2014-10-25 NOTE — Telephone Encounter (Signed)
i dont want to really hear about these approvals   When he gets the approval then sched appt   It clouds my in box
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
## 2014-10-26 ENCOUNTER — Emergency Department (HOSPITAL_COMMUNITY)
Admission: EM | Admit: 2014-10-26 | Discharge: 2014-10-26 | Disposition: A | Discharge status: Home / Self Care | Payer: 59 | Attending: Emergency Medicine | Admitting: Emergency Medicine

## 2014-10-26 ENCOUNTER — Encounter (HOSPITAL_COMMUNITY): Payer: Self-pay | Admitting: Emergency Medicine

## 2014-10-26 DIAGNOSIS — Z8669 Personal history of other diseases of the nervous system and sense organs: Secondary | ICD-10-CM | POA: Diagnosis not present

## 2014-10-26 DIAGNOSIS — Z720 Tobacco use: Secondary | ICD-10-CM | POA: Diagnosis not present

## 2014-10-26 DIAGNOSIS — M25512 Pain in left shoulder: Secondary | ICD-10-CM | POA: Insufficient documentation

## 2014-10-26 DIAGNOSIS — Z760 Encounter for issue of repeat prescription: Secondary | ICD-10-CM | POA: Diagnosis present

## 2014-10-26 DIAGNOSIS — Z79899 Other long term (current) drug therapy: Secondary | ICD-10-CM | POA: Insufficient documentation

## 2014-10-26 DIAGNOSIS — Z792 Long term (current) use of antibiotics: Secondary | ICD-10-CM | POA: Diagnosis not present

## 2014-10-26 DIAGNOSIS — Z791 Long term (current) use of non-steroidal anti-inflammatories (NSAID): Secondary | ICD-10-CM | POA: Insufficient documentation

## 2014-10-26 DIAGNOSIS — F419 Anxiety disorder, unspecified: Secondary | ICD-10-CM | POA: Insufficient documentation

## 2014-10-26 MED ORDER — OXYCODONE-ACETAMINOPHEN 5-325 MG PO TABS: 1.0000 | ORAL_TABLET | ORAL | Status: DC | PRN

## 2014-10-26 MED ORDER — CYCLOBENZAPRINE HCL 10 MG PO TABS: 10.0000 mg | ORAL_TABLET | Freq: Two times a day (BID) | ORAL | Status: DC | PRN
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
## 2014-10-26 NOTE — Discharge Instructions (Signed)
Medication Refill, Emergency Department °We have refilled your medication today as a courtesy to you. It is best for your medical care, however, to take care of getting refills done through your primary caregiver's office. They have your records and can do a better job of follow-up than we can in the emergency department. °On maintenance medications, we often only prescribe enough medications to get you by until you are able to see your regular caregiver. This is a more expensive way to refill medications. °In the future, please plan for refills so that you will not have to use the emergency department for this. °Thank you for your help. Your help allows us to better take care of the daily emergencies that enter our department. °Document Released: 11/30/2003 Document Revised: 11/05/2011 Document Reviewed: 11/20/2013 °ExitCare® Patient Information ©2015 [employer]. This information is not intended to replace advice given to you by your health care provider. Make sure you discuss any questions you have with your health care provider. ° °

## 2014-10-26 NOTE — ED Provider Notes (Signed)
CSN: [id number]     Arrival date & time 10/26/14  1628 History   None    Chief Complaint  Patient presents with  . Arm Pain     (Consider location/radiation/quality/duration/timing/severity/associated sxs/prior Treatment) The history is provided by the patient.  Michael Price is a 39 y.o. male who presents to the ED for refill of his pain medication. He states that he was evaluated for rotator cuff injury to his left shoulder and was told he needed surgery. He has been trying to get in with the orthopedic doctor but has to be referred from a PCP and he does not have one at this time. He has tried to get in with several doctor and they are either not taking new patient or they have to go through a "process" before they will accept them. He states that they told him it would take 30 days. Patient is out of his pain medication and requesting more.   Past Medical History  Diagnosis Date  . Mastoiditis of left side   . Anxiety    Past Surgical History  Procedure Laterality Date  . Mastoidectomy revision    . Othropedic    . Arm surgery    . Ear cyst excision     No family history on file. History  Substance Use Topics  . Smoking status: Current Some Day Smoker -- 0.02 packs/day for 2 years    Types: Cigarettes  . Smokeless tobacco: Never Used  . Alcohol Use: Yes     Comment: occasional    Review of Systems Negative except as stated in HPI   Allergies  Review of patient's allergies indicates no known allergies.  Home Medications   Prior to Admission medications   Medication Sig Start Date End Date Taking? Authorizing Provider  amoxicillin-clavulanate (AUGMENTIN) 875-125 MG per tablet Take 1 tablet by mouth 2 (two) times daily. 04/17/14   Lyanne Co, MD  amoxicillin-clavulanate (AUGMENTIN) 875-125 MG per tablet Take 1 tablet by mouth 2 (two) times daily. 04/26/14   Carlisle Beers Molpus, MD  Aspirin-Salicylamide-Caffeine (BC HEADACHE PO) Take 1 Package by mouth daily as needed  (Occasionally used for pain).     Historical Provider, MD  cyclobenzaprine (FLEXERIL) 10 MG tablet Take 1 tablet (10 mg total) by mouth 2 (two) times daily as needed for muscle spasms. 10/26/14   Hope Orlene Och, NP  esomeprazole (NEXIUM) 40 MG capsule Take 40 mg by mouth every other day.     Historical Provider, MD  HYDROcodone-acetaminophen (NORCO) 5-325 MG per tablet Take 1-2 tablets by mouth every 6 (six) hours as needed (for pain). 04/26/14   John L Molpus, MD  LORazepam (ATIVAN) 1 MG tablet Take 1 tablet (1 mg total) by mouth 3 (three) times daily as needed for anxiety. 01/04/14   Vanetta Mulders, MD  naproxen (NAPROSYN) 500 MG tablet Take 1 tablet (500 mg total) by mouth 2 (two) times daily with a meal. 10/23/14   Tammy L. Triplett, PA-C  oxyCODONE-acetaminophen (ROXICET) 5-325 MG per tablet Take 1 tablet by mouth every 4 (four) hours as needed for severe pain. 10/26/14   Hope Orlene Och, NP  pantoprazole (PROTONIX) 20 MG tablet Take 1 tablet (20 mg total) by mouth daily. 01/04/14   Vanetta Mulders, MD   BP 127/84 mmHg  Pulse 89  Temp(Src) 98.6 F (37 C) (Oral)  Resp 18  Ht 6' (1.829 m)  Wt 234 lb (106.142 kg)  BMI 31.73 kg/m2  SpO2 100% Physical Exam  Constitutional: He is oriented to person, place, and time. He appears well-developed and well-nourished. No distress.  Eyes: Conjunctivae and EOM are normal.  Neck: Normal range of motion. Neck supple.  Cardiovascular: Normal rate.   Pulmonary/Chest: Effort normal.  Musculoskeletal:       Right shoulder: He exhibits decreased range of motion.  Left arm in a sling, limited range of motion due to pain.   Neurological: He is alert and oriented to person, place, and time. No cranial nerve deficit.  Skin: Skin is warm and dry.  Psychiatric: He has a normal mood and affect. His behavior is normal.  Nursing note and vitals reviewed.   ED Course  Procedures  MDM  39 y.o. male with left shoulder pain since injury. Continue to wear sling, pain  management[facility]. Patient agrees with plan and will call for appointment.  Final diagnoses:  Encounter for medication refill      Janne NapoleonHope M Neese, NP 10/26/14 2217  Rolland PorterMark James, MD 10/30/14 [phone]

## 2014-10-26 NOTE — ED Notes (Signed)
Patient here for refill for pain medication and orthopedic refferal for fractured arm and torn rotator cuff. Patient reports being seen here on 2/27 and given percocet. Per patient took last one today. Patient has sling on incorrectly. Sling readjusted to proper position. No splint noted.

## 2014-10-27 NOTE — Telephone Encounter (Signed)
Patient aware of status

## 2014-11-04 ENCOUNTER — Ambulatory Visit (INDEPENDENT_AMBULATORY_CARE_PROVIDER_SITE_OTHER): Payer: 59 | Admitting: Orthopedic Surgery

## 2014-11-04 ENCOUNTER — Encounter: Payer: Self-pay | Admitting: Orthopedic Surgery

## 2014-11-04 VITALS — Resp 20 | Ht 72.0 in | Wt 234.0 lb

## 2014-11-04 DIAGNOSIS — S4352XA Sprain of left acromioclavicular joint, initial encounter: Secondary | ICD-10-CM

## 2014-11-04 MED ORDER — OXYCODONE-ACETAMINOPHEN 5-325 MG PO TABS: 1.0000 | ORAL_TABLET | ORAL | Status: DC | PRN
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
## 2014-11-16 ENCOUNTER — Encounter: Payer: Self-pay | Admitting: Orthopedic Surgery

## 2014-11-16 ENCOUNTER — Ambulatory Visit: Payer: Self-pay | Admitting: Orthopedic Surgery

## 2014-11-21 ENCOUNTER — Emergency Department (HOSPITAL_COMMUNITY)
Admission: EM | Admit: 2014-11-21 | Discharge: 2014-11-21 | Disposition: A | Discharge status: Home / Self Care | Payer: 59 | Attending: Emergency Medicine | Admitting: Emergency Medicine

## 2014-11-21 ENCOUNTER — Encounter (HOSPITAL_COMMUNITY): Payer: Self-pay | Admitting: Emergency Medicine

## 2014-11-21 DIAGNOSIS — Z8669 Personal history of other diseases of the nervous system and sense organs: Secondary | ICD-10-CM | POA: Insufficient documentation

## 2014-11-21 DIAGNOSIS — R51 Headache: Secondary | ICD-10-CM | POA: Diagnosis present

## 2014-11-21 DIAGNOSIS — Z720 Tobacco use: Secondary | ICD-10-CM | POA: Insufficient documentation

## 2014-11-21 DIAGNOSIS — Z8659 Personal history of other mental and behavioral disorders: Secondary | ICD-10-CM | POA: Diagnosis not present

## 2014-11-21 DIAGNOSIS — J0100 Acute maxillary sinusitis, unspecified: Secondary | ICD-10-CM | POA: Diagnosis not present

## 2014-11-21 DIAGNOSIS — R5383 Other fatigue: Secondary | ICD-10-CM | POA: Diagnosis not present

## 2014-11-21 MED ORDER — OXYCODONE-ACETAMINOPHEN 5-325 MG PO TABS
1.0000 | ORAL_TABLET | Freq: Once | ORAL | Status: AC
  Administered 2014-11-21: 1 via ORAL
  Filled 2014-11-21: qty 1

## 2014-11-21 MED ORDER — AMOXICILLIN-POT CLAVULANATE 875-125 MG PO TABS
1.0000 | ORAL_TABLET | Freq: Once | ORAL | Status: AC
  Administered 2014-11-21: 1 via ORAL
  Filled 2014-11-21: qty 1

## 2014-11-21 MED ORDER — OXYCODONE-ACETAMINOPHEN 5-325 MG PO TABS: 1.0000 | ORAL_TABLET | Freq: Four times a day (QID) | ORAL | Status: DC | PRN

## 2014-11-21 MED ORDER — AMOXICILLIN-POT CLAVULANATE 875-125 MG PO TABS: 1.0000 | ORAL_TABLET | Freq: Two times a day (BID) | ORAL | Status: DC

## 2014-11-21 NOTE — ED Notes (Signed)
Patient complaining of sinus congestion, headache, drainage and redness to eyes bilaterally, states light bothers his eyes.

## 2014-11-21 NOTE — Discharge Instructions (Signed)

## 2014-11-21 NOTE — ED Provider Notes (Signed)
CSN: [id number]     Arrival date & time 11/21/14  1626 History   First MD Initiated Contact with Patient 11/21/14 1738     Chief Complaint  Patient presents with  . Sinus Problem  . Headache     (Consider location/radiation/quality/duration/timing/severity/associated sxs/prior Treatment) Patient is a 39 y.o. male presenting with sinus complaint and headaches. The history is provided by the patient.  Sinus Problem This is a recurrent problem. Associated symptoms include headaches. Pertinent negatives include no chest pain, no abdominal pain and no shortness of breath.  Headache Associated symptoms: congestion, eye pain, fatigue and sinus pressure   Associated symptoms: no abdominal pain, no back pain, no fever, no hearing loss and no sore throat    patient has had bilateral frontal face pain for the last 7 days. States he thinks he has a sinusitis. Has a history of the same. Has had purulent drainage from his nose and eyes. States he has had some chills. States he feels as if there is fever. States his eyes and red. Some photophobia. No cough. No sore throat. No pain in his ears. Previous history for mastoiditis.  Past Medical History  Diagnosis Date  . Mastoiditis of left side   . Anxiety    Past Surgical History  Procedure Laterality Date  . Mastoidectomy revision    . Othropedic    . Arm surgery    . Ear cyst excision     History reviewed. No pertinent family history. History  Substance Use Topics  . Smoking status: Current Some Day Smoker -- 0.02 packs/day for 2 years    Types: Cigarettes  . Smokeless tobacco: Never Used  . Alcohol Use: Yes     Comment: occasional    Review of Systems  Constitutional: Positive for chills and fatigue. Negative for fever.  HENT: Positive for congestion and sinus pressure. Negative for ear discharge, facial swelling, hearing loss, sore throat and voice change.   Eyes: Positive for pain, discharge and redness.  Respiratory: Negative for  chest tightness and shortness of breath.   Cardiovascular: Negative for chest pain.  Gastrointestinal: Negative for abdominal pain.  Genitourinary: Negative for dysuria.  Musculoskeletal: Negative for back pain.  Skin: Negative for wound.  Neurological: Positive for headaches.      Allergies  Review of patient's allergies indicates no known allergies.  Home Medications   Prior to Admission medications   Medication Sig Start Date End Date Taking? Authorizing Provider  amoxicillin-clavulanate (AUGMENTIN) 875-125 MG per tablet Take 1 tablet by mouth every 12 (twelve) hours. 11/21/14   Benjiman Core, MD  Methocarbamol (ROBAXIN-750 PO) Take by mouth.    Historical Provider, MD  oxyCODONE-acetaminophen (PERCOCET/ROXICET) 5-325 MG per tablet Take 1-2 tablets by mouth every 6 (six) hours as needed. 11/21/14   Benjiman Core, MD  Ranitidine HCl (ZANTAC PO) Take by mouth.    Historical Provider, MD   BP 126/83 mmHg  Pulse 102  Temp(Src) 98.9 F (37.2 C) (Oral)  Resp 18  Ht  (1.803 m)  Wt 220 lb (99.791 kg)  BMI 30.70 kg/m2  SpO2 100% Physical Exam  Constitutional: He appears well-developed.  HENT:  Head: Normocephalic.  Tenderness over bilateral mastoid sinuses. No swelling. No tenderness over mastoids. Bilateral TMs normal.  Eyes:  No discharge. Some conjunctival injection. Eye movements intact.  Cardiovascular: Normal rate and regular rhythm.   Pulmonary/Chest: Effort normal.  Abdominal: Soft.  Musculoskeletal: Normal range of motion.  Neurological: He is alert.  Skin: Skin is  warm.    ED Course  Procedures (including critical care time) Labs Review Labs Reviewed - No data to display  Imaging Review No results found.   EKG Interpretation None      MDM   Final diagnoses:  Acute maxillary sinusitis, recurrence not specified    Patient with likely bilateral maxillary sinusitis. Has had previous sinusitis of his mastoid requiring surgery. Has had  continued symptoms are week. Will treat with antibiotics now.    Benjiman CoreNathan Osiris Charles, MD 11/21/14 [phone]

## 2014-11-30 ENCOUNTER — Other Ambulatory Visit: Payer: Self-pay | Admitting: *Deleted

## 2014-11-30 ENCOUNTER — Telehealth: Payer: Self-pay | Admitting: Orthopedic Surgery

## 2014-11-30 MED ORDER — OXYCODONE-ACETAMINOPHEN 5-325 MG PO TABS: 1.0000 | ORAL_TABLET | ORAL | Status: DC | PRN

## 2014-11-30 NOTE — Telephone Encounter (Signed)
Patient is calling requesting a refill on pain medication until he can be seen 12/09/14 oxyCODONE-acetaminophen (PERCOCET/ROXICET) 5-325 MG per tablet please advise?

## 2014-12-01 NOTE — Telephone Encounter (Signed)
Patient picked up Rx

## 2014-12-01 NOTE — Telephone Encounter (Signed)
Patient aware, awaiting approval of refill on pain medication as noted, and has called back to relay, though he knows he missed his originally scheduled follow up appointment, that his shoulder has a 'bump' or 'hematoma', and he is concerned about waiting until next week, 12/09/14, re-scheduled appointment.  Please advise.

## 2014-12-01 NOTE — Telephone Encounter (Signed)
Prescription available, patient aware  

## 2014-12-09 ENCOUNTER — Ambulatory Visit (INDEPENDENT_AMBULATORY_CARE_PROVIDER_SITE_OTHER): Payer: 59

## 2014-12-09 ENCOUNTER — Ambulatory Visit (INDEPENDENT_AMBULATORY_CARE_PROVIDER_SITE_OTHER): Payer: 59 | Admitting: Orthopedic Surgery

## 2014-12-09 ENCOUNTER — Encounter: Payer: Self-pay | Admitting: Orthopedic Surgery

## 2014-12-09 VITALS — BP 125/85 | Ht 71.0 in | Wt 220.0 lb

## 2014-12-09 DIAGNOSIS — M25512 Pain in left shoulder: Secondary | ICD-10-CM

## 2014-12-09 DIAGNOSIS — S4352XD Sprain of left acromioclavicular joint, subsequent encounter: Secondary | ICD-10-CM

## 2014-12-09 MED ORDER — HYDROCODONE-ACETAMINOPHEN 5-325 MG PO TABS: 1.0000 | ORAL_TABLET | Freq: Four times a day (QID) | ORAL | Status: DC | PRN

## 2014-12-09 NOTE — Progress Notes (Signed)
Follow-up visit the patient injured his before meals joint and had an underlying previous midshaft humerus fracture which was treated closed. But his acute injury on February 27 was of his before meals joint was treated with a sling he said he was recovering well and was taken Percocet for pain did fine until he fell again so we x-rayed him again there is no new injury in terms of a fracture or ligament injury.   Review of systems the radial nerve remains intact axillary nerve intact by clinical exam   Vital signs stable as recorded patient awake  An alert appears to be oriented mood flat gait normal wrist extension intact skin abrasions over the deltoid area  Tenderness there swelling there  He has swelling over his deltoid and proximal humerus he has prominence of the humerus from the way the fracture healed prior to his before meals joint injury. We recommend he take Norco and do exercises he wears a sling for comfort he does not need to come back.

## 2014-12-16 ENCOUNTER — Telehealth: Payer: Self-pay | Admitting: Orthopedic Surgery

## 2014-12-16 NOTE — Telephone Encounter (Signed)
CALLED PATIENT, NO ANSWER °

## 2014-12-16 NOTE — Telephone Encounter (Signed)
Patient is calling c/o arm pain, he states that he is allergic to Hydrocodone and he is asking for a refill onoxyCODONE-acetaminophen (PERCOCET/ROXICET) 5-325 MG per tablet please advise?

## 2014-12-16 NOTE — Telephone Encounter (Signed)
PATIENTS CHART SHOWS HE HAS BEEN PRESCRIBED HYDROCODONE MORE THAN ONCE IN THE PAST, NO ALLERGY LISTED, AND IT IS A WEEK AFTER PRESCRIPTION WAS WRITTEN, NO NEW SCRIPT

## 2014-12-20 NOTE — Telephone Encounter (Signed)
WAS THIS THE APPROPRIATE RESPONSE??  PATIENT IS CALLING BACK

## 2014-12-23 NOTE — Telephone Encounter (Signed)
Routing to Dr Harrison 

## 2014-12-23 NOTE — Telephone Encounter (Signed)
Patient has called back about medication question as noted (per phone notes 4/21-4/25/16); states he has "no medication at all."  Also relates 'shoulder is popping' - and he is asking if he needs to schedule a follow up visit.  Please advise patient ph# [phone]

## 2014-12-25 NOTE — Telephone Encounter (Signed)
I do not have any other treatment options for him  he can take Tylenol or ibuprofen and we can refer him to a shoulder specialist or traumatologist  if he's having further problems with his arm

## 2014-12-27 NOTE — Telephone Encounter (Signed)
Patient is asking to be referred to a shoulder specialist, Michael NiemannJaime please advise?

## 2014-12-29 NOTE — Telephone Encounter (Signed)
PATIENTS INSURANCE REQUIRES REFERRAL TO COME FROM PCP.  NOTES AND RECOMMENDATION FAXED TO DR Olena LeatherwoodHASANAJ

## 2015-01-19 ENCOUNTER — Ambulatory Visit (INDEPENDENT_AMBULATORY_CARE_PROVIDER_SITE_OTHER): Payer: 59 | Admitting: Family Medicine

## 2015-01-19 ENCOUNTER — Encounter: Payer: Self-pay | Admitting: Family Medicine

## 2015-01-19 VITALS — BP 120/85 | HR 107 | Temp 98.3°F | Ht 71.0 in | Wt 232.0 lb

## 2015-01-19 DIAGNOSIS — F410 Panic disorder [episodic paroxysmal anxiety] without agoraphobia: Secondary | ICD-10-CM

## 2015-01-19 MED ORDER — DULOXETINE HCL 30 MG PO CPEP: 30.0000 mg | ORAL_CAPSULE | Freq: Every day | ORAL | Status: DC

## 2015-01-19 MED ORDER — CLONAZEPAM 1 MG PO TABS: 1.0000 mg | ORAL_TABLET | Freq: Two times a day (BID) | ORAL | Status: DC | PRN
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
## 2015-01-20 ENCOUNTER — Telehealth: Payer: Self-pay

## 2015-01-20 MED ORDER — DULOXETINE HCL 60 MG PO CPEP: 60.0000 mg | ORAL_CAPSULE | Freq: Every day | ORAL | Status: DC

## 2015-01-20 MED ORDER — DULOXETINE HCL 30 MG PO CPEP: 30.0000 mg | ORAL_CAPSULE | Freq: Every day | ORAL | Status: DC

## 2015-01-20 NOTE — Telephone Encounter (Signed)
Yes, please make that change and send in the prescription for me. thanks, WS.

## 2015-01-20 NOTE — Telephone Encounter (Signed)
Insurance won't pay for Duloxetine 30 mg twice a day  Can you change to Duloxetine 60 mg daily???

## 2015-01-20 NOTE — Telephone Encounter (Signed)
Pt notified of RX Verbalizes understanding 

## 2015-02-09 ENCOUNTER — Telehealth: Payer: Self-pay | Admitting: Family Medicine

## 2015-02-09 MED ORDER — DULOXETINE HCL 30 MG PO CPEP: ORAL_CAPSULE | ORAL | Status: DC

## 2015-02-09 NOTE — Telephone Encounter (Signed)
Patient called and stated that insurance would not cover Cymbalta 60mg  daily but would cover Cymbalta 30mg  BID. Changed at pharmacy per patient request

## 2015-02-10 ENCOUNTER — Encounter: Payer: 59 | Admitting: Family Medicine

## 2015-02-14 ENCOUNTER — Other Ambulatory Visit: Payer: Self-pay | Admitting: Family Medicine

## 2015-02-14 ENCOUNTER — Telehealth: Payer: Self-pay | Admitting: Family Medicine

## 2015-02-14 ENCOUNTER — Other Ambulatory Visit: Payer: Self-pay | Admitting: *Deleted

## 2015-02-14 MED ORDER — CLONAZEPAM 1 MG PO TABS: 1.0000 mg | ORAL_TABLET | Freq: Two times a day (BID) | ORAL | Status: DC | PRN

## 2015-02-14 NOTE — Telephone Encounter (Signed)
1 week's worth of klonopin called into pharmacy.  Patient notified.

## 2015-02-14 NOTE — Telephone Encounter (Signed)
Last seen 01/19/15 Dr Darlyn Read  If approved route to nurse to call into [facility]

## 2015-02-14 NOTE — Telephone Encounter (Signed)
Dr Stacks - please advise  

## 2015-02-14 NOTE — Telephone Encounter (Signed)
Okay to send in 1 week supply. Thanks, WS

## 2015-02-21 ENCOUNTER — Ambulatory Visit: Payer: 59 | Admitting: Family Medicine

## 2015-02-22 ENCOUNTER — Emergency Department (HOSPITAL_COMMUNITY)
Admission: EM | Admit: 2015-02-22 | Discharge: 2015-02-22 | Disposition: A | Discharge status: Home / Self Care | Payer: 59 | Attending: Emergency Medicine | Admitting: Emergency Medicine

## 2015-02-22 ENCOUNTER — Encounter (HOSPITAL_COMMUNITY): Payer: Self-pay | Admitting: Emergency Medicine

## 2015-02-22 DIAGNOSIS — F419 Anxiety disorder, unspecified: Secondary | ICD-10-CM | POA: Diagnosis not present

## 2015-02-22 DIAGNOSIS — Z79899 Other long term (current) drug therapy: Secondary | ICD-10-CM | POA: Insufficient documentation

## 2015-02-22 DIAGNOSIS — Z720 Tobacco use: Secondary | ICD-10-CM | POA: Diagnosis not present

## 2015-02-22 DIAGNOSIS — Z9889 Other specified postprocedural states: Secondary | ICD-10-CM | POA: Diagnosis not present

## 2015-02-22 DIAGNOSIS — H9202 Otalgia, left ear: Secondary | ICD-10-CM | POA: Insufficient documentation

## 2015-02-22 MED ORDER — HYDROCODONE-ACETAMINOPHEN 5-325 MG PO TABS
1.0000 | ORAL_TABLET | Freq: Once | ORAL | Status: AC
  Administered 2015-02-22: 1 via ORAL
  Filled 2015-02-22: qty 1

## 2015-02-22 MED ORDER — AMOXICILLIN-POT CLAVULANATE 875-125 MG PO TABS: 1.0000 | ORAL_TABLET | Freq: Two times a day (BID) | ORAL | Status: DC

## 2015-02-22 MED ORDER — HYDROCODONE-ACETAMINOPHEN 5-325 MG PO TABS
1.0000 | ORAL_TABLET | Freq: Four times a day (QID) | ORAL | Status: DC | PRN
Start: 2015-02-22 — End: 2015-03-01

## 2015-02-22 NOTE — ED Provider Notes (Signed)
CSN: [id number]     Arrival date & time 02/22/15  6295 History   First MD Initiated Contact with Patient 02/22/15 0345     Chief Complaint  Patient presents with  . 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 Patient reports a one-month history of worsening otalgia on the left. He denies any fevers. He states that he has noted some drainage from that ear. He rates his pain a 10 out of 10. It is not improved with NSAIDs. He also reports itching behind the ear. He states that he has had difficulty getting back into see his ENT doctor. Denies headache or other systemic symptoms.  Past Medical History  Diagnosis Date  . Mastoiditis of left side   . Anxiety    Past Surgical History  Procedure Laterality Date  . Mastoidectomy revision    . Othropedic    . Arm surgery    . Ear cyst excision     No family history on file. History  Substance Use Topics  . Smoking status: Current Some Day Smoker -- 0.02 packs/day for 2 years    Types: Cigarettes  . Smokeless tobacco: Never Used  . Alcohol Use: Yes     Comment: occasional    Review of Systems  Constitutional: Negative.  Negative for fever.  HENT: Positive for ear pain.   Respiratory: Negative.  Negative for chest tightness and shortness of breath.   Cardiovascular: Negative.  Negative for chest pain.  Gastrointestinal: Negative.   Genitourinary: Negative.  Negative for dysuria.  Skin: Negative for wound.  Neurological: Negative for headaches.  All other systems reviewed and are negative.     Allergies  Review of patient's allergies indicates no known allergies.  Home Medications   Prior to Admission medications   Medication Sig Start Date End Date Taking? Authorizing Provider  amoxicillin-clavulanate (AUGMENTIN) 875-125 MG per  tablet Take 1 tablet by mouth 2 (two) times daily. 02/22/15   Shon Baton, MD  clonazePAM (KLONOPIN) 1 MG tablet Take 1 tablet (1 mg total) by mouth 2 (two) times daily as needed for anxiety. 02/14/15   Mechele Claude, MD  DULoxetine (CYMBALTA) 30 MG capsule Take 1 tablet twice daily 02/09/15   Mechele Claude, MD  DULoxetine (CYMBALTA) 60 MG capsule Take 1 capsule (60 mg total) by mouth daily. 01/20/15   Mechele Claude, MD  HYDROcodone-acetaminophen (NORCO/VICODIN) 5-325 MG per tablet Take 1-2 tablets by mouth every 6 (six) hours as needed. 02/22/15   Shon Baton, MD   BP 133/101 mmHg  Pulse 90  Temp(Src) 97.1 F (36.2 C)  Resp 18  Ht 6' (1.829 m)  Wt 230 lb (104.327 kg)  BMI 31.19 kg/m2  SpO2 100% Physical Exam  Constitutional: He is oriented to person, place, and time. He appears well-developed and well-nourished.  Very anxious appearing  HENT:  Head: Normocephalic and atraumatic.  Right Ear: External ear normal.  Patient with mild redness and excoriation behind the left ear, no obvious otitis externa or evidence of otitis media, light reflex appears intact, no drainage in the canal, mild tenderness of the pinna, old surgical scar noted  Eyes: Pupils are equal, round, and reactive to light.  Cardiovascular: Normal rate, regular rhythm and normal heart sounds.   No murmur heard. Pulmonary/Chest: Effort normal and breath sounds normal. No respiratory distress. He has no wheezes.  Abdominal: Soft. Bowel sounds are normal. There is no tenderness. There is no rebound.  Musculoskeletal: He exhibits no edema.  Neurological: He is alert and oriented to person, place, and time.  Skin: Skin is warm and dry.  Psychiatric: He has a normal mood and affect.  Nursing note and vitals reviewed.   ED Course  Procedures (including critical care time) Labs Review Labs Reviewed - No data to display  Imaging Review No results found.   EKG Interpretation None      MDM   Final  diagnoses:  Otalgia, left    Patient presents with left ear pain. History of mastoidectomy requiring revision.  Nontoxic on exam. Afebrile. Ongoing left ear pain for 1 month. No obvious infection. There is mild erythema just posterior to the ear and mild pinna tenderness. No signs of otitis externa or media. Patient has had similar presentations in the past. Given that he is afebrile and otherwise well-appearing and the duration of the symptoms are one-month, feel he would be best served by following up with his ENT doctor. However, given his history will empirically place him on Augmentin and he will be given a short course of pain medication.  After history, exam, and medical workup I feel the patient has been appropriately medically screened and is safe for discharge home. Pertinent diagnoses were discussed with the patient. Patient was given return precautions.    Shon Batonourtney F Jelani Trueba, MD 02/22/15 [phone]
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
## 2015-02-22 NOTE — ED Notes (Signed)
Pt c/o left ear pain x one month.

## 2015-02-22 NOTE — Discharge Instructions (Signed)
You were seen today for ear pain. There is no obvious infection. However, given your history he will be given antibiotics. He will also be given pain medication. You need to follow-up with her ENT surgeon as soon as possible. If he develops fever, increasing redness, worsening pain or any new or worsening symptoms she should be reevaluated immediately.  Otalgia The most common reason for this in children is an infection of the middle ear. Pain from the middle ear is usually caused by a build-up of fluid and pressure behind the eardrum. Pain from an earache can be sharp, dull, or burning. The pain may be temporary or constant. The middle ear is connected to the nasal passages by a short narrow tube called the Eustachian tube. The Eustachian tube allows fluid to drain out of the middle ear, and helps keep the pressure in your ear equalized. CAUSES  A cold or allergy can block the Eustachian tube with inflammation and the build-up of secretions. This is especially likely in small children, because their Eustachian tube is shorter and more horizontal. When the Eustachian tube closes, the normal flow of fluid from the middle ear is stopped. Fluid can accumulate and cause stuffiness, pain, hearing loss, and an ear infection if germs start growing in this area. SYMPTOMS  The symptoms of an ear infection may include fever, ear pain, fussiness, increased crying, and irritability. Many children will have temporary and minor hearing loss during and right after an ear infection. Permanent hearing loss is rare, but the risk increases the more infections a child has. Other causes of ear pain include retained water in the outer ear canal from swimming and bathing. Ear pain in adults is less likely to be from an ear infection. Ear pain may be referred from other locations. Referred pain may be from the joint between your jaw and the skull. It may also come from a tooth problem or problems in the neck. Other causes of ear  pain include:  A foreign body in the ear.  Outer ear infection.  Sinus infections.  Impacted ear wax.  Ear injury.  Arthritis of the jaw or TMJ problems.  Middle ear infection.  Tooth infections.  Sore throat with pain to the ears. DIAGNOSIS  Your caregiver can usually make the diagnosis by examining you. Sometimes other special studies, including x-rays and lab work may be necessary. TREATMENT   If antibiotics were prescribed, use them as directed and finish them even if you or your child's symptoms seem to be improved.  Sometimes PE tubes are needed in children. These are little plastic tubes which are put into the eardrum during a simple surgical procedure. They allow fluid to drain easier and allow the pressure in the middle ear to equalize. This helps relieve the ear pain caused by pressure changes. HOME CARE INSTRUCTIONS   Only take over-the-counter or prescription medicines for pain, discomfort, or fever as directed by your caregiver. DO NOT GIVE CHILDREN ASPIRIN because of the association of Reye's Syndrome in children taking aspirin.  Use a cold pack applied to the outer ear for 15-20 minutes, 03-04 times per day or as needed may reduce pain. Do not apply ice directly to the skin. You may cause frost bite.  Over-the-counter ear drops used as directed may be effective. Your caregiver may sometimes prescribe ear drops.  Resting in an upright position may help reduce pressure in the middle ear and relieve pain.  Ear pain caused by rapidly descending from high altitudes  can be relieved by swallowing or chewing gum. Allowing infants to suck on a bottle during airplane travel can help.  Do not smoke in the house or near children. If you are unable to quit smoking, smoke outside.  Control allergies. SEEK IMMEDIATE MEDICAL CARE IF:   You or your child are becoming sicker.  Pain or fever relief is not obtained with medicine.  You or your child's symptoms (pain, fever,  or irritability) do not improve within 24 to 48 hours or as instructed.  Severe pain suddenly stops hurting. This may indicate a ruptured eardrum.  You or your children develop new problems such as severe headaches, stiff neck, difficulty swallowing, or swelling of the face or around the ear. Document Released: 03/30/2004 Document Revised: 11/05/2011 Document Reviewed: 08/04/2008 [facility] Patient Information 2015 [place], [place]. This information is not intended to replace advice given to you by your health care provider. Make sure you discuss any questions you have with your health care provider.

## 2015-02-23 ENCOUNTER — Other Ambulatory Visit: Payer: Self-pay | Admitting: Family Medicine

## 2015-02-23 NOTE — Telephone Encounter (Signed)
Please review and advise.

## 2015-02-24 NOTE — Telephone Encounter (Signed)
Please review and advise.

## 2015-02-24 NOTE — Telephone Encounter (Signed)
Pt notified [employer] before refill

## 2015-02-24 NOTE — Telephone Encounter (Signed)
Last seen 01/19/15 Dr Darlyn ReadStacks   Last filled 02/14/15 #14   If approved [facility]

## 2015-02-25 ENCOUNTER — Emergency Department (HOSPITAL_COMMUNITY)
Admission: EM | Admit: 2015-02-25 | Discharge: 2015-02-26 | Disposition: A | Discharge status: Home / Self Care | Payer: 59 | Attending: Emergency Medicine | Admitting: Emergency Medicine

## 2015-02-25 DIAGNOSIS — Z87891 Personal history of nicotine dependence: Secondary | ICD-10-CM | POA: Insufficient documentation

## 2015-02-25 DIAGNOSIS — H9202 Otalgia, left ear: Secondary | ICD-10-CM | POA: Insufficient documentation

## 2015-02-25 DIAGNOSIS — Y998 Other external cause status: Secondary | ICD-10-CM | POA: Diagnosis not present

## 2015-02-25 DIAGNOSIS — L0291 Cutaneous abscess, unspecified: Secondary | ICD-10-CM

## 2015-02-25 DIAGNOSIS — Y9289 Other specified places as the place of occurrence of the external cause: Secondary | ICD-10-CM | POA: Diagnosis not present

## 2015-02-25 DIAGNOSIS — L02211 Cutaneous abscess of abdominal wall: Secondary | ICD-10-CM | POA: Diagnosis not present

## 2015-02-25 DIAGNOSIS — Z79899 Other long term (current) drug therapy: Secondary | ICD-10-CM | POA: Insufficient documentation

## 2015-02-25 DIAGNOSIS — S76811A Strain of other specified muscles, fascia and tendons at thigh level, right thigh, initial encounter: Secondary | ICD-10-CM | POA: Diagnosis not present

## 2015-02-25 DIAGNOSIS — X58XXXA Exposure to other specified factors, initial encounter: Secondary | ICD-10-CM | POA: Insufficient documentation

## 2015-02-25 DIAGNOSIS — J340 Abscess, furuncle and carbuncle of nose: Secondary | ICD-10-CM | POA: Diagnosis not present

## 2015-02-25 DIAGNOSIS — Y9389 Activity, other specified: Secondary | ICD-10-CM | POA: Diagnosis not present

## 2015-02-25 DIAGNOSIS — F419 Anxiety disorder, unspecified: Secondary | ICD-10-CM | POA: Insufficient documentation

## 2015-02-25 DIAGNOSIS — G8929 Other chronic pain: Secondary | ICD-10-CM | POA: Diagnosis not present

## 2015-02-25 DIAGNOSIS — S39013A Strain of muscle, fascia and tendon of pelvis, initial encounter: Secondary | ICD-10-CM

## 2015-02-26 ENCOUNTER — Encounter (HOSPITAL_COMMUNITY): Payer: Self-pay

## 2015-02-26 MED ORDER — TRAMADOL HCL 50 MG PO TABS
50.0000 mg | ORAL_TABLET | Freq: Once | ORAL | Status: AC
  Administered 2015-02-26: 50 mg via ORAL
  Filled 2015-02-26: qty 1

## 2015-02-26 MED ORDER — TRAMADOL HCL 50 MG PO TABS: 50.0000 mg | ORAL_TABLET | Freq: Four times a day (QID) | ORAL | Status: DC | PRN

## 2015-02-26 NOTE — ED Notes (Signed)
Multiple complaints, states left ear pain with drainage, boil in groin area, pain in groin and testicles after lifting an engine.  Pt states he took his last hydrocodone today

## 2015-02-26 NOTE — ED Provider Notes (Signed)
TIME SEEN: 12:12 AM  CHIEF COMPLAINT: Ear pain  HPI:  HPI Comments: Michael Price is a 39 y.o. male with hx mastoiditis on left and mastoidectomy x 2 who presents to the Emergency Department complaining of severe, gradually worsening, left ear pain with drainage x 1 month. He also complains of associated headache. He has been applying ice without relief. Pt was seen in ED on 02/22/2015 (approximately 4 days ago) for same symptoms. He was advised to follow up with ENT doctor and prescribed Augmentin.  Also given 10 hydrocodone tablets which he reports he ran out today.He claims to being complaint with antibiotics. Pt states that he has been unable to get a follow up appointment to ENT from his PCP because he reports his primary care physician is out of town. He states that he called his PCP today and was told to come to the ED for further evaluation.  He is seeking pain medication at this time. Denies fever.   Pt also complains of pain in the right inguinal area for the past 3 days after lifting heavy object. No testicular swelling, scrotal masses. No dysuria or hematuria.  Describes pain as throbbing. Worse with walking. Better with staying still. No radiation of pain. No history of kidney stones.   Also reports he has had a small draining abscess to the mid abdomen at the level of his belt line. States it opened up on its own has been draining.    PCP - Dr. Darlyn ReadStacks  ENT - Dr. Jena GaussMaxwell   ROS: See HPI Constitutional: no fever  Eyes: no drainage  ENT: Left ear pain. no runny nose   Cardiovascular:  no chest pain  Resp: no SOB  GI: no vomiting GU: Dysuria, testicular pain bilaterally. no dysuria Integumentary: Abscess to abdomen. no rash  Allergy: no hives  Musculoskeletal: no leg swelling  Neurological: Headache. no slurred speech ROS otherwise negative  PAST MEDICAL HISTORY/PAST SURGICAL HISTORY:  Past Medical History  Diagnosis Date  . Mastoiditis of left side   . Anxiety      MEDICATIONS:  Prior to Admission medications   Medication Sig Start Date End Date Taking? Authorizing Provider  amoxicillin-clavulanate (AUGMENTIN) 875-125 MG per tablet Take 1 tablet by mouth 2 (two) times daily. 02/22/15   Shon Batonourtney F Horton, MD  clonazePAM (KLONOPIN) 1 MG tablet Take 1 tablet (1 mg total) by mouth 2 (two) times daily as needed for anxiety. 02/14/15   Mechele ClaudeWarren Stacks, MD  DULoxetine (CYMBALTA) 30 MG capsule Take 1 tablet twice daily 02/09/15   Mechele ClaudeWarren Stacks, MD  DULoxetine (CYMBALTA) 60 MG capsule Take 1 capsule (60 mg total) by mouth daily. 01/20/15   Mechele ClaudeWarren Stacks, MD  HYDROcodone-acetaminophen (NORCO/VICODIN) 5-325 MG per tablet Take 1-2 tablets by mouth every 6 (six) hours as needed. 02/22/15   Shon Batonourtney F Horton, MD    ALLERGIES:  No Known Allergies  SOCIAL HISTORY:  History  Substance Use Topics  . Smoking status: Former Smoker -- 0.02 packs/day for 2 years    Types: Cigarettes  . Smokeless tobacco: Never Used  . Alcohol Use: Yes     Comment: occasional    FAMILY HISTORY: No family history on file.  EXAM: Triage Vitals: BP 139/103 mmHg  Pulse 111  Temp(Src) 98.1 F (36.7 C) (Oral)  Resp 20  Ht 6' (1.829 m)  Wt 222 lb (100.699 kg)  BMI 30.10 kg/m2  SpO2 99%   CONSTITUTIONAL: Alert and oriented and responds appropriately to questions. Well-appearing; well-nourished, nontoxic,  afebrile, very talkative HEAD: Normocephalic EYES: Conjunctivae clear, PERRL ENT: normal nose; no rhinorrhea; moist mucous membranes; pharynx without lesions noted, TMs are clear bilaterally, no signs of otitis externa, no signs of mastoiditis, he is tender to palpation over the left mastoid, no erythema or warmth, no fluctuance or induration, no drainage from the  Ear, no dental abscess or dental caries, normal phonation, no trismus or drooling, no Ludwigs angina, no stridor NECK: Supple, no meningismus, no LAD  CARD: regular and minimally tachycardic; S1 and S2 appreciated; no  murmurs, no clicks, no rubs, no gallops RESP: Normal chest excursion without splinting or tachypnea; breath sounds clear and equal bilaterally; no wheezes, no rhonchi, no rales, no hypoxia or respiratory distress, speaking full sentences ABD/GI: Normal bowel sounds; non-distended; soft, non-tender, no rebound, no guarding, no peritoneal signs; small open once in the abscess to the lower abdomen at the belt line that is draining with no surrounding cellulitis, no fluctuance; no tenderness at McBurney's point GU:  Patient is tender over the right inguinal area, no testicular masses or pain, no scrotal masses, no hernia appreciated, no perineal warmth or erythema or crepitus, circumcised male, normal urethral meatus without blood or discharge BACK:  The back appears normal and is non-tender to palpation, there is no CVA tenderness EXT: Normal ROM in all joints; non-tender to palpation; no edema; normal capillary refill; no cyanosis, no calf tenderness or swelling    SKIN: Normal color for age and race; warm NEURO: Moves all extremities equally, sensation to light touch intact diffusely, cranial nerves II through XII intact; normal gait PSYCH: The patient's mood and manner are appropriate. Grooming and personal hygiene are appropriate.  Rapid speech.  No disorganized thoughts.    MEDICAL DECISION MAKING: patient here with multiple complaints. Suspect that he is truly here because he wants more pain medication for his chronic left ear pain. Have discussed with him at length that he needs to follow-up with his ENT physician and his PCP. He is currently on Augmentin. He has no obvious sign of infection on exam. Afebrile and hemodynamically stable.  Also complaining of an abscess to the lower abdomen. Abscess is small and is already open and draining without surrounding cellulitis or fluctuance. I do not feel he needs further antibiotics treatment for this in this area does not need to be opened further.  Also  complaining of right inguinal painfor 3 days it is worse with movement which I think is due to a muscle strain. He is tender over the right inguinal area. No testicular pain, swelling or masses. No hernia appreciated. Denies dysuria. No high riding testicle. I do not feel he needs a scrotal ultrasound at this time. Doubt torsion.  Given complaints of pain to both his ear and his right inguinal region I will discharge him with a very short prescription for tramadol. Have discussed with him at length that he needs to follow-up with her primary care physician and his ENT.  Discussed return precautions. He verbalizes understanding and is comfortable with this plan.   I personally performed the services described in this documentation, which was scribed in my presence. The recorded information has been reviewed and is accurate.    Layla Maw Ward, DO 02/26/15 0045

## 2015-02-26 NOTE — Discharge Instructions (Signed)
Abscess An abscess is an infected area that contains a collection of pus and debris.It can occur in almost any part of the body. An abscess is also known as a furuncle or boil. CAUSES  An abscess occurs when tissue gets infected. This can occur from blockage of oil or sweat glands, infection of hair follicles, or a minor injury to the skin. As the body tries to fight the infection, pus collects in the area and creates pressure under the skin. This pressure causes pain. People with weakened immune systems have difficulty fighting infections and get certain abscesses more often.  SYMPTOMS Usually an abscess develops on the skin and becomes a painful mass that is red, warm, and tender. If the abscess forms under the skin, you may feel a moveable soft area under the skin. Some abscesses break open (rupture) on their own, but most will continue to get worse without care. The infection can spread deeper into the body and eventually into the bloodstream, causing you to feel ill.  DIAGNOSIS  Your caregiver will take your medical history and perform a physical exam. A sample of fluid may also be taken from the abscess to determine what is causing your infection. TREATMENT  Your caregiver may prescribe antibiotic medicines to fight the infection. However, taking antibiotics alone usually does not cure an abscess. Your caregiver may need to make a small cut (incision) in the abscess to drain the pus. In some cases, gauze is packed into the abscess to reduce pain and to continue draining the area. HOME CARE INSTRUCTIONS   Only take over-the-counter or prescription medicines for pain, discomfort, or fever as directed by your caregiver.  If you were prescribed antibiotics, take them as directed. Finish them even if you start to feel better.  If gauze is used, follow your caregiver's directions for changing the gauze.  To avoid spreading the infection:  Keep your draining abscess covered with a  bandage.  Wash your hands well.  Do not share personal care items, towels, or whirlpools with others.  Avoid skin contact with others.  Keep your skin and clothes clean around the abscess.  Keep all follow-up appointments as directed by your caregiver. SEEK MEDICAL CARE IF:   You have increased pain, swelling, redness, fluid drainage, or bleeding.  You have muscle aches, chills, or a general ill feeling.  You have a fever. MAKE SURE YOU:   Understand these instructions.  Will watch your condition.  Will get help right away if you are not doing well or get worse. Document Released: 05/23/2005 Document Revised: 02/12/2012 Document Reviewed: 10/26/2011 [facility] Patient Information 2015 [place], [place]. This information is not intended to replace advice given to you by your health care provider. Make sure you discuss any questions you have with your health care provider.  Chronic Pain Chronic pain can be defined as pain that is off and on and lasts for 3-6 months or longer. Many things cause chronic pain, which can make it difficult to make a diagnosis. There are many treatment options available for chronic pain. However, finding a treatment that works well for you may require trying various approaches until the right one is found. Many people benefit from a combination of two or more types of treatment to control their pain. SYMPTOMS  Chronic pain can occur anywhere in the body and can range from mild to very severe. Some types of chronic pain include:  Headache.  Low back pain.  Cancer pain.  Arthritis pain.  Neurogenic  pain. This is pain resulting from damage to nerves. People with chronic pain may also have other symptoms such as:  Depression.  Anger.  Insomnia.  Anxiety. DIAGNOSIS  Your health care provider will help diagnose your condition over time. In many cases, the initial focus will be on excluding possible conditions that could be causing the pain.  Depending on your symptoms, your health care provider may order tests to diagnose your condition. Some of these tests may include:   Blood tests.   CT scan.   MRI.   X-rays.   Ultrasounds.   Nerve conduction studies.  You may need to see a specialist.  TREATMENT  Finding treatment that works well may take time. You may be referred to a pain specialist. He or she may prescribe medicine or therapies, such as:   Mindful meditation or yoga.  Shots (injections) of numbing or pain-relieving medicines into the spine or area of pain.  Local electrical stimulation.  Acupuncture.   Massage therapy.   Aroma, color, light, or sound therapy.   Biofeedback.   Working with a physical therapist to keep from getting stiff.   Regular, gentle exercise.   Cognitive or behavioral therapy.   Group support.  Sometimes, surgery may be recommended.  HOME CARE INSTRUCTIONS   Take all medicines as directed by your health care provider.   Lessen stress in your life by relaxing and doing things such as listening to calming music.   Exercise or be active as directed by your health care provider.   Eat a healthy diet and include things such as vegetables, fruits, fish, and lean meats in your diet.   Keep all follow-up appointments with your health care provider.   Attend a support group with others suffering from chronic pain. SEEK MEDICAL CARE IF:   Your pain gets worse.   You develop a new pain that was not there before.   You cannot tolerate medicines given to you by your health care provider.   You have new symptoms since your last visit with your health care provider.  SEEK IMMEDIATE MEDICAL CARE IF:   You feel weak.   You have decreased sensation or numbness.   You lose control of bowel or bladder function.   Your pain suddenly gets much worse.   You develop shaking.  You develop chills.  You develop confusion.  You develop chest  pain.  You develop shortness of breath.  MAKE SURE YOU:  Understand these instructions.  Will watch your condition.  Will get help right away if you are not doing well or get worse. Document Released: 05/05/2002 Document Revised: 04/15/2013 Document Reviewed: 02/06/2013 [facility] Patient Information 2015 [place]. This information is not intended to replace advice given to you by your health care provider. Make sure you discuss any questions you have with your health care provider.  Groin Strain A groin strain (also called a groin pull) is an injury to the muscles or tendon on the upper inner part of the thigh. These muscles are called the adductor muscles or groin muscles. They are responsible for moving the leg across the body. A muscle strain occurs when a muscle is overstretched and some muscle fibers are torn. A groin strain can range from mild to severe depending on how many muscle fibers are affected and whether the muscle fibers are partially or completely torn.  Groin strains usually occur during exercise or participation in sports. The injury often happens when a sudden, violent force is  placed on a muscle, stretching the muscle too far. A strain is more likely to occur when your muscles are not warmed up or if you are not properly conditioned. Depending on the severity of the groin strain, recovery time may vary from a few weeks to several weeks. Severe injuries often require 4-6 weeks for recovery. In these cases, complete healing can take 4-5 months.  CAUSES   Stretching the groin muscles too far or too suddenly, often during side-to-side motion with an abrupt change in direction.  Putting repeated stress on the groin muscles over a long period of time.  Performing vigorous activity without properly stretching the groin muscles beforehand. SYMPTOMS   Pain and tenderness in the groin area. This begins as sharp pain and persists as a dull ache.  Popping or snapping  feeling when the injury occurs (for severe strains).  Swelling or bruising.  Muscle spasms.  Weakness in the leg.  Stiffness in the groin area with decreased ability to move the affected muscles. DIAGNOSIS  Your caregiver will perform a physical exam to diagnose a groin strain. You will be asked about your symptoms and how the injury occurred. X-rays are sometimes needed to rule out a broken bone or cartilage problems. Your caregiver may order a CT scan or MRI if a complete muscle tear is suspected. TREATMENT  A groin strain will often heal on its own. Your caregiver may prescribe medicines to help manage pain and swelling (anti-inflammatory medicine). You may be told to use crutches for the first few days to minimize your pain. HOME CARE INSTRUCTIONS   Rest. Do not use the strained muscle if it causes pain.  Put ice on the injured area.  Put ice in a plastic bag.  Place a towel between your skin and the bag.  Leave the ice on for 15-20 minutes, every 2-3 hours. Do this for the first 2 days after the injury.  Only take over-the-counter or prescription medicines as directed by your caregiver.  Wrap the injured area with an elastic bandage as directed by your caregiver.  Keep the injured leg raised (elevated).  Walk, stretch, and perform range-of-motion exercises to improve blood flow to the injured area. Only perform these activities if you can do so without any pain. To prevent muscle strains:  Warm up before exercise.  Develop proper conditioning and strength in the groin muscles. SEEK IMMEDIATE MEDICAL CARE IF:   You have increased pain or swelling in the affected area.   Your symptoms are not improving or are getting worse. MAKE SURE YOU:   Understand these instructions.  Will watch your condition.  Will get help right away if you are not doing well or get worse. Document Released: 04/10/2004 Document Revised: 07/30/2012 Document Reviewed: 04/16/2012 [facility]  Patient Information 2015 [place], [place]. This information is not intended to replace advice given to you by your health care provider. Make sure you discuss any questions you have with your health care provider.

## 2015-03-01 ENCOUNTER — Encounter: Payer: Self-pay | Admitting: Family Medicine

## 2015-03-01 ENCOUNTER — Ambulatory Visit (INDEPENDENT_AMBULATORY_CARE_PROVIDER_SITE_OTHER): Payer: 59 | Admitting: Family Medicine

## 2015-03-01 VITALS — BP 131/90 | HR 112 | Temp 98.4°F | Ht 72.0 in | Wt 231.6 lb

## 2015-03-01 DIAGNOSIS — F410 Panic disorder [episodic paroxysmal anxiety] without agoraphobia: Secondary | ICD-10-CM

## 2015-03-01 DIAGNOSIS — N508 Other specified disorders of male genital organs: Secondary | ICD-10-CM | POA: Diagnosis not present

## 2015-03-01 DIAGNOSIS — IMO0002 Reserved for concepts with insufficient information to code with codable children: Secondary | ICD-10-CM

## 2015-03-01 MED ORDER — CLONAZEPAM 1 MG PO TABS: 1.0000 mg | ORAL_TABLET | Freq: Two times a day (BID) | ORAL | Status: DC | PRN

## 2015-03-01 MED ORDER — VENLAFAXINE HCL ER 37.5 MG PO TB24: ORAL_TABLET | ORAL | Status: DC

## 2015-03-01 NOTE — Progress Notes (Signed)
Subjective:  Patient ID: Michael Price, male    DOB: [birth year]  Age: 39 y.o. MRN: [id number]  CC: GAD and Abdominal Pain   HPI Michael Price presents for continued symptoms of anxiety. He says that the duloxetine worked wonders but his insurance has not been willing to cover it. He thinks he just got the first month because they asked that we build it under his father's name because they have the same name. Unfortunately he has not been able to get the duloxetine. He is continued take the clonazepam with good relief. His anxiety symptoms are primarily nervousness and anxiousness palpitations and jitteriness. Sometimes he feels panicked.  Patient has some abdominal and genital discomfort based on getting hit by a broken component of a cherry picker. When it broke a piece fell and hit him in the lower abdomen and groin. Since then his testicles have been painful and swollen. He went to the emergency room and was given Augmentin. He continues to take that. The pain has been persistent. It has been constant. It is a dull ache with occasional sharp pains. It is 4-5/10. There is also an open wound on the lower abdomen.   History Demon has a past medical history of Mastoiditis of left side and Anxiety.   He has past surgical history that includes Mastoidectomy revision; othropedic; arm surgery; and Ear Cyst Excision.   His family history is not on file.He reports that he has quit smoking. His smoking use included Cigarettes. He has a .04 pack-year smoking history. He has never used smokeless tobacco. He reports that he drinks alcohol. He reports that he does not use illicit drugs.  Outpatient Prescriptions Prior to Visit  Medication Sig Dispense Refill  . traMADol (ULTRAM) 50 MG tablet Take 1 tablet (50 mg total) by mouth every 6 (six) hours as needed. 10 tablet 0  . amoxicillin-clavulanate (AUGMENTIN) 875-125 MG per tablet Take 1 tablet by mouth 2 (two) times daily. 20 tablet 0  . clonazePAM  (KLONOPIN) 1 MG tablet Take 1 tablet (1 mg total) by mouth 2 (two) times daily as needed for anxiety. 14 tablet 0  . DULoxetine (CYMBALTA) 30 MG capsule Take 1 tablet twice daily (Patient not taking: Reported on 03/01/2015) 60 capsule 3  . DULoxetine (CYMBALTA) 60 MG capsule Take 1 capsule (60 mg total) by mouth daily. (Patient not taking: Reported on 03/01/2015) 30 capsule 3  . HYDROcodone-acetaminophen (NORCO/VICODIN) 5-325 MG per tablet Take 1-2 tablets by mouth every 6 (six) hours as needed. (Patient not taking: Reported on 03/01/2015) 10 tablet 0   No facility-administered medications prior to visit.    ROS Review of Systems  Constitutional: Negative for fever, chills and diaphoresis.  HENT: Negative for congestion, rhinorrhea and sore throat.   Respiratory: Negative for cough, shortness of breath and wheezing.   Cardiovascular: Negative for chest pain.  Gastrointestinal: Negative for nausea, vomiting, abdominal pain, diarrhea, constipation and abdominal distention.  Genitourinary: Negative for dysuria and frequency.  Musculoskeletal: Negative for joint swelling and arthralgias.  Skin: Negative for rash.  Neurological: Negative for headaches.  Psychiatric/Behavioral: Positive for decreased concentration and agitation. The patient is nervous/anxious.     Objective:  BP 131/90 mmHg  Pulse 112  Temp(Src) 98.4 F (36.9 C) (Oral)  Ht 6' (1.829 m)  Wt 231 lb 9.6 oz (105.053 kg)  BMI 31.40 kg/m2  BP Readings from Last 3 Encounters:  03/01/15 131/90  02/26/15 139/103  02/22/15 133/101    Wt Readings from  Last 3 Encounters:  03/01/15 231 lb 9.6 oz (105.053 kg)  02/26/15 222 lb (100.699 kg)  02/22/15 230 lb (104.327 kg)     Physical Exam  Constitutional: He is oriented to person, place, and time. He appears well-developed and well-nourished. No distress.  HENT:  Head: Normocephalic and atraumatic.  Right Ear: External ear normal.  Left Ear: External ear normal.  Nose: Nose  normal.  Mouth/Throat: Oropharynx is clear and moist.  Eyes: Conjunctivae and EOM are normal. Pupils are equal, round, and reactive to light.  Neck: Normal range of motion. Neck supple. No thyromegaly present.  Cardiovascular: Normal rate, regular rhythm and normal heart sounds.   No murmur heard. Pulmonary/Chest: Effort normal and breath sounds normal. No respiratory distress. He has no wheezes. He has no rales.  Abdominal: Soft. Bowel sounds are normal. He exhibits no distension. There is no tenderness.  Genitourinary: Penile tenderness present.  There is moderate tenderness and edema of both testicles. There is no erythema there is no hematoma of the scrotum  Lymphadenopathy:    He has no cervical adenopathy.  Neurological: He is alert and oriented to person, place, and time. He has normal reflexes.  Skin: Skin is warm and dry.  There is a superficial abrasion/laceration at the abdominal fold midline. This is open to the subcutaneous tissue. There is evidence for good healing and good granulation. It measures about 1 x 2 cm.  Psychiatric: He has a normal mood and affect. His behavior is normal. Judgment and thought content normal.    No results found for: HGBA1C  Lab Results  Component Value Date   WBC 5.7 02/12/2010   HGB 14.9 02/12/2010   HCT 44.2 02/12/2010   PLT 340 02/12/2010   GLUCOSE 101* 02/12/2010   NA 136 02/12/2010   K 3.8 02/12/2010   CL 108 02/12/2010   CREATININE 0.89 02/12/2010   BUN 15 02/12/2010   CO2 24 02/12/2010    No results found.  Assessment & Plan:   [name] was seen today for gad and abdominal pain.  Diagnoses and all orders for this visit:  Pain in scrotum or testicle Orders: -     US Scrotum; Future  Panic anxiety syndrome  Other orders -     Venlafaxine HCl 37.5 MG TB24; 1 qday with food X 7d, then 2 qdayX 7, then 3 q day X 7, then 4 q day -     clonazePAM (KLONOPIN) 1 MG tablet; Take 1 tablet (1 mg total) by mouth 2 (two) times daily  as needed for anxiety.   I have discontinued Mr. [name]'s DULoxetine, DULoxetine, amoxicillin-clavulanate, and HYDROcodone-acetaminophen. I am also having him start on Venlafaxine HCl. Additionally, I am having him maintain his traMADol and clonazePAM.  Meds ordered this encounter  Medications  . Venlafaxine HCl 37.5 MG TB24    Sig: 1 qday with food X 7d, then 2 qdayX 7, then 3 q day X 7, then 4 q day    Dispense:  82 each    Refill:  0  . clonazePAM (KLONOPIN) 1 MG tablet    Sig: Take 1 tablet (1 mg total) by mouth 2 (two) times daily as needed for anxiety.    Dispense:  60 tablet    Refill:  0     Follow-up: Return in about 1 month (around 04/01/2015) for anxiety.  Mechele ClaudeWarren Dion Sibal, M.D.

## 2015-03-02 ENCOUNTER — Other Ambulatory Visit: Payer: Self-pay | Admitting: Family Medicine

## 2015-03-02 ENCOUNTER — Telehealth: Payer: Self-pay | Admitting: Family Medicine

## 2015-03-02 DIAGNOSIS — IMO0002 Reserved for concepts with insufficient information to code with codable children: Secondary | ICD-10-CM

## 2015-03-02 NOTE — Telephone Encounter (Signed)
Patient was referred back to the emergency department.

## 2015-03-03 ENCOUNTER — Telehealth: Payer: Self-pay | Admitting: Family Medicine

## 2015-03-04 ENCOUNTER — Ambulatory Visit: Payer: 59 | Admitting: Family Medicine

## 2015-03-04 ENCOUNTER — Ambulatory Visit (HOSPITAL_COMMUNITY): Admission: RE | Admit: 2015-03-04 | Payer: 59 | Source: Ambulatory Visit

## 2015-03-07 ENCOUNTER — Ambulatory Visit: Payer: 59 | Admitting: Family Medicine

## 2015-03-07 ENCOUNTER — Ambulatory Visit (HOSPITAL_COMMUNITY): Admission: RE | Admit: 2015-03-07 | Payer: 59 | Source: Ambulatory Visit

## 2015-03-27 NOTE — Progress Notes (Deleted)
Subjective:  Patient ID: Michael Price, male    DOB: March 17, 1976  Age: 39 y.o. MRN: [id number]  CC: No chief complaint on file.   HPI Michael Price presents for one month recheck for anxiety. Hx of panic disorder. Also scrotal pain. [place] ordered but not performed.  History Michael Price has a past medical history of Mastoiditis of left side and Anxiety.   Michael Price has past surgical history that includes Mastoidectomy revision; othropedic; arm surgery; and Ear Cyst Excision.   His family history is not on file.Michael Price reports that Michael Price has quit smoking. His smoking use included Cigarettes. Michael Price has a .04 pack-year smoking history. Michael Price has never used smokeless tobacco. Michael Price reports that Michael Price drinks alcohol. Michael Price reports that Michael Price does not use illicit drugs.  Outpatient Prescriptions Prior to Visit  Medication Sig Dispense Refill  . clonazePAM (KLONOPIN) 1 MG tablet Take 1 tablet (1 mg total) by mouth 2 (two) times daily as needed for anxiety. 60 tablet 0  . traMADol (ULTRAM) 50 MG tablet Take 1 tablet (50 mg total) by mouth every 6 (six) hours as needed. 10 tablet 0  . Venlafaxine HCl 37.5 MG TB24 1 qday with food X 7d, then 2 qdayX 7, then 3 q day X 7, then 4 q day 82 each 0   No facility-administered medications prior to visit.    ROS Review of Systems  Constitutional: Negative for fever, chills and diaphoresis.  HENT: Negative for congestion, rhinorrhea and sore throat.   Respiratory: Negative for cough, shortness of breath and wheezing.   Cardiovascular: Negative for chest pain.  Gastrointestinal: Negative for nausea, vomiting, abdominal pain, diarrhea, constipation and abdominal distention.  Genitourinary: Negative for dysuria and frequency.  Musculoskeletal: Negative for joint swelling and arthralgias.  Skin: Negative for rash.  Neurological: Negative for headaches.    Objective:  There were no vitals taken for this visit.  BP Readings from Last 3 Encounters:  03/01/15 131/90  02/26/15 139/103    02/22/15 133/101    Wt Readings from Last 3 Encounters:  03/01/15 231 lb 9.6 oz (105.053 kg)  02/26/15 222 lb (100.699 kg)  02/22/15 230 lb (104.327 kg)     Physical Exam  Constitutional: Michael Price is oriented to person, place, and time. Michael Price appears well-developed and well-nourished. No distress.  HENT:  Head: Normocephalic and atraumatic.  Right Ear: External ear normal.  Left Ear: External ear normal.  Nose: Nose normal.  Mouth/Throat: Oropharynx is clear and moist.  Eyes: Conjunctivae and EOM are normal. Pupils are equal, round, and reactive to light.  Neck: Normal range of motion. Neck supple. No thyromegaly present.  Cardiovascular: Normal rate, regular rhythm and normal heart sounds.   No murmur heard. Pulmonary/Chest: Effort normal and breath sounds normal. No respiratory distress. Michael Price has no wheezes. Michael Price has no rales.  Abdominal: Soft. Bowel sounds are normal. Michael Price exhibits no distension. There is no tenderness.  Lymphadenopathy:    Michael Price has no cervical adenopathy.  Neurological: Michael Price is alert and oriented to person, place, and time. Michael Price has normal reflexes.  Skin: Skin is warm and dry.  Psychiatric: Michael Price has a normal mood and affect. His behavior is normal. Judgment and thought content normal.    No results found for: HGBA1C  Lab Results  Component Value Date   WBC 5.7 02/12/2010   HGB 14.9 02/12/2010   HCT 44.2 02/12/2010   PLT 340 02/12/2010   GLUCOSE 101* 02/12/2010   NA 136 02/12/2010   K 3.8 02/12/2010  CL 108 02/12/2010   CREATININE 0.89 02/12/2010   BUN 15 02/12/2010   CO2 24 02/12/2010    No results found.  Assessment & Plan:   There are no diagnoses linked to this encounter. I am having Mr. Arlington maintain his traMADol, Venlafaxine HCl, and clonazePAM.  No orders of the defined types were placed in this encounter.     Follow-up: No Follow-up on file.  Mechele Claude, M.D.

## 2015-03-28 ENCOUNTER — Encounter: Payer: 59 | Admitting: Family Medicine

## 2015-03-28 NOTE — Progress Notes (Signed)
erroneous

## 2015-03-29 ENCOUNTER — Other Ambulatory Visit: Payer: Self-pay | Admitting: Family Medicine

## 2015-03-29 NOTE — Telephone Encounter (Signed)
Quanity? Needs follow up

## 2015-03-30 ENCOUNTER — Ambulatory Visit (INDEPENDENT_AMBULATORY_CARE_PROVIDER_SITE_OTHER): Payer: 59 | Admitting: Family Medicine

## 2015-03-30 ENCOUNTER — Encounter: Payer: Self-pay | Admitting: Family Medicine

## 2015-03-30 VITALS — BP 119/88 | HR 78 | Temp 99.1°F | Ht 72.0 in | Wt 223.0 lb

## 2015-03-30 DIAGNOSIS — J019 Acute sinusitis, unspecified: Secondary | ICD-10-CM | POA: Diagnosis not present

## 2015-03-30 MED ORDER — AMOXICILLIN-POT CLAVULANATE 875-125 MG PO TABS: 1.0000 | ORAL_TABLET | Freq: Two times a day (BID) | ORAL | Status: DC

## 2015-03-30 MED ORDER — VENLAFAXINE HCL ER 37.5 MG PO TB24
ORAL_TABLET | ORAL | Status: DC
Start: 2015-03-30 — End: 2015-04-29

## 2015-03-30 MED ORDER — CLONAZEPAM 1 MG PO TABS: 1.0000 mg | ORAL_TABLET | Freq: Two times a day (BID) | ORAL | Status: DC | PRN

## 2015-03-30 NOTE — Patient Instructions (Addendum)
Take meds as directed Take antibiotic twice daily with food until completed Use nasal saline, Flonase, and Mucinex maximum strength as directed Drink plenty of fluids Take Tylenol for aches pains and fever

## 2015-03-30 NOTE — Progress Notes (Signed)
Subjective:    Patient ID: Michael Price, male    DOB: [birth year], 39 y.o.   MRN: [id number]  HPI  Patient here today with complaints of earache, headache, cough and runny nose for two weeks.       There are no active problems to display for this patient.  Outpatient Encounter Prescriptions as of 03/30/2015  Medication Sig  . clonazePAM (KLONOPIN) 1 MG tablet Take 1 tablet (1 mg total) by mouth 2 (two) times daily as needed for anxiety.  . Venlafaxine HCl 37.5 MG TB24 1 qday with food X 7d, then 2 qdayX 7, then 3 q day X 7, then 4 q day  . [DISCONTINUED] traMADol (ULTRAM) 50 MG tablet Take 1 tablet (50 mg total) by mouth every 6 (six) hours as needed. (Patient not taking: Reported on 03/30/2015)   No facility-administered encounter medications on file as of 03/30/2015.     Review of Systems  Constitutional: Positive for fatigue.  HENT: Positive for congestion, ear pain and sinus pressure.   Eyes: Negative.   Respiratory: Positive for cough.   Cardiovascular: Negative.   Gastrointestinal: Negative.   Endocrine: Negative.   Genitourinary: Negative.   Musculoskeletal: Negative.   Skin: Negative.   Allergic/Immunologic: Negative.   Neurological: Negative.   Hematological: Negative.   Psychiatric/Behavioral: Negative.        Objective:   Physical Exam  Constitutional: He is oriented to person, place, and time. He appears well-developed and well-nourished. No distress.  HENT:  Head: Normocephalic and atraumatic.  Right Ear: External ear normal.  Mouth/Throat: Oropharynx is clear and moist. No oropharyngeal exudate.  There is nasal congestion and turbinate swelling bilaterally. The maxillary sinuses ethmoid sinus and frontal sinuses are all tender to palpation. There is no purulent drainage in the throat. The left TM is distorted--this is from previous surgery.  Eyes: Conjunctivae and EOM are normal. Pupils are equal, round, and reactive to light. Right eye exhibits no  discharge. Left eye exhibits no discharge. No scleral icterus.  Neck: Normal range of motion. Neck supple. No thyromegaly present.  Cardiovascular: Normal rate, regular rhythm and normal heart sounds.   No murmur heard. Pulmonary/Chest: Effort normal and breath sounds normal. No respiratory distress. He has no wheezes. He has no rales. He exhibits no tenderness.  The patient has a dry cough.  Musculoskeletal: Normal range of motion.  Lymphadenopathy:    He has no cervical adenopathy.  Neurological: He is alert and oriented to person, place, and time.  Skin: Skin is warm and dry. No rash noted.  Psychiatric: He has a normal mood and affect. His behavior is normal. Judgment and thought content normal.  The patient feels bad because of all the sinus congestion.  Nursing note and vitals reviewed.  BP 119/88 mmHg  Pulse 78  Temp(Src) 99.1 F (37.3 C) (Oral)  Ht 6' (1.829 m)  Wt 223 lb (101.152 kg)  BMI 30.24 kg/m2        Assessment & Plan:  1. Acute rhinosinusitis -Take antibiotic twice daily with food until completed -Use nasal saline and Mucinex and Flonase as directed  Meds ordered this encounter  Medications  . amoxicillin-clavulanate (AUGMENTIN) 875-125 MG per tablet    Sig: Take 1 tablet by mouth 2 (two) times daily.    Dispense:  20 tablet    Refill:  0  . Venlafaxine HCl 37.5 MG TB24    Sig: 1 qday with food X 7d, then 2 qdayX 7, then 3 q  day X 7, then 4 q day    Dispense:  82 each    Refill:  1  . clonazePAM (KLONOPIN) 1 MG tablet    Sig: Take 1 tablet (1 mg total) by mouth 2 (two) times daily as needed for anxiety.    Dispense:  60 tablet    Refill:  0   Patient Instructions  Take meds as directed Take antibiotic twice daily with food until completed Use nasal saline, Flonase, and Mucinex maximum strength as directed Drink plenty of fluids Take Tylenol for aches pains and fever   Nyra Capes MD

## 2015-04-04 ENCOUNTER — Encounter: Payer: Self-pay | Admitting: Family Medicine

## 2015-04-04 ENCOUNTER — Telehealth: Payer: Self-pay | Admitting: Family Medicine

## 2015-04-04 MED ORDER — PREDNISONE 10 MG PO TABS: ORAL_TABLET | ORAL | Status: DC

## 2015-04-04 NOTE — Telephone Encounter (Signed)
Augmentin is a good antibiotic for rhinosinusitis. Have patient take Flonase over-the-counter one at 2 sprays at bedtime. Also call in a prescription for prednisone 10 mg #20 taper. If he does not get better within a few days he should come back to the office for recheck.

## 2015-04-04 NOTE — Telephone Encounter (Signed)
Pt aware - kmart

## 2015-04-06 ENCOUNTER — Telehealth: Payer: Self-pay | Admitting: Family Medicine

## 2015-04-06 ENCOUNTER — Ambulatory Visit: Payer: 59 | Admitting: Family Medicine

## 2015-04-06 NOTE — Telephone Encounter (Signed)
Pt called -  Prednisone seemed to make his HA worse Using sprays - but feels worse NTBS

## 2015-04-08 ENCOUNTER — Encounter: Payer: 59 | Admitting: Family Medicine

## 2015-04-11 ENCOUNTER — Ambulatory Visit (INDEPENDENT_AMBULATORY_CARE_PROVIDER_SITE_OTHER): Payer: 59 | Admitting: Family Medicine

## 2015-04-11 ENCOUNTER — Encounter (INDEPENDENT_AMBULATORY_CARE_PROVIDER_SITE_OTHER): Payer: Self-pay

## 2015-04-11 ENCOUNTER — Encounter: Payer: Self-pay | Admitting: Family Medicine

## 2015-04-11 VITALS — BP 142/87 | HR 92 | Temp 98.0°F | Ht 72.0 in | Wt 236.8 lb

## 2015-04-11 DIAGNOSIS — G44219 Episodic tension-type headache, not intractable: Secondary | ICD-10-CM

## 2015-04-11 DIAGNOSIS — J019 Acute sinusitis, unspecified: Secondary | ICD-10-CM | POA: Diagnosis not present

## 2015-04-11 MED ORDER — BETAMETHASONE 0.6 MG/5ML PO SOLN: 1.2000 mg | Freq: Two times a day (BID) | ORAL | Status: DC

## 2015-04-11 MED ORDER — BETAMETHASONE SOD PHOS & ACET 6 (3-3) MG/ML IJ SUSP
6.0000 mg | Freq: Once | INTRAMUSCULAR | Status: AC
  Administered 2015-04-11: 6 mg via INTRAMUSCULAR

## 2015-04-11 MED ORDER — PSEUDOEPHEDRINE-GUAIFENESIN ER 120-1200 MG PO TB12: 1.0000 | ORAL_TABLET | Freq: Two times a day (BID) | ORAL | Status: DC

## 2015-04-11 MED ORDER — LEVOFLOXACIN 500 MG PO TABS: 500.0000 mg | ORAL_TABLET | Freq: Every day | ORAL | Status: DC

## 2015-04-11 NOTE — Progress Notes (Signed)
Subjective:  Patient ID: Michael Price, male    DOB: [birth year]  Age: 39 y.o. MRN: [id number]  CC: Sinusitis and Cough   HPI Michael Price presents for rhinorrhea. Was dyspneic when last seen. That has resolved. Michael Price still has some facial pressure. That has actually gotten worse. The rhinorrhea is usually clear but there is some purulence. Pain is through the cheeks and causes some headache around the eyes and into the parietal scalp.  History Michael Price has a past medical history of Mastoiditis of left side and Anxiety.   Michael Price has past surgical history that includes Mastoidectomy revision; othropedic; arm surgery; and Ear Cyst Excision.   His family history is not on file.Michael Price reports that Michael Price has quit smoking. His smoking use included Cigarettes. Michael Price has a .04 pack-year smoking history. Michael Price has never used smokeless tobacco. Michael Price reports that Michael Price drinks alcohol. Michael Price reports that Michael Price does not use illicit drugs.  Outpatient Prescriptions Prior to Visit  Medication Sig Dispense Refill  . clonazePAM (KLONOPIN) 1 MG tablet Take 1 tablet (1 mg total) by mouth 2 (two) times daily as needed for anxiety. 60 tablet 0  . Venlafaxine HCl 37.5 MG TB24 1 qday with food X 7d, then 2 qdayX 7, then 3 q day X 7, then 4 q day (Patient not taking: Reported on 04/11/2015) 82 each 1  . amoxicillin-clavulanate (AUGMENTIN) 875-125 MG per tablet Take 1 tablet by mouth 2 (two) times daily. 20 tablet 0  . predniSONE (DELTASONE) 10 MG tablet TAPER- 1 tab by mouth four times daily for 2 days, 1 tab by mouth three times daily for 2 days, 1 tab by mouth twice a day for 2 days, then 1 tab by mouth daily for 2 days, then stop. 20 tablet 0   No facility-administered medications prior to visit.    ROS Review of Systems  Constitutional: Negative for fever, chills, activity change and appetite change.  HENT: Positive for congestion, postnasal drip, rhinorrhea and sinus pressure. Negative for ear discharge, ear pain, hearing loss,  nosebleeds, sneezing and trouble swallowing.   Respiratory: Negative for chest tightness and shortness of breath.   Cardiovascular: Negative for chest pain and palpitations.  Skin: Negative for rash.    Objective:  BP 142/87 mmHg  Pulse 92  Temp(Src) 98 F (36.7 C) (Oral)  Ht 6' (1.829 m)  Wt 236 lb 12.8 oz (107.412 kg)  BMI 32.11 kg/m2  BP Readings from Last 3 Encounters:  04/11/15 142/87  03/30/15 119/88  03/01/15 131/90    Wt Readings from Last 3 Encounters:  04/11/15 236 lb 12.8 oz (107.412 kg)  03/30/15 223 lb (101.152 kg)  03/01/15 231 lb 9.6 oz (105.053 kg)     Physical Exam  Constitutional: Michael Price appears well-developed and well-nourished.  HENT:  Head: Normocephalic and atraumatic.  Right Ear: Tympanic membrane and external ear normal. No decreased hearing is noted.  Left Ear: Tympanic membrane and external ear normal. No decreased hearing is noted.  Nose: Mucosal edema present. Right sinus exhibits no frontal sinus tenderness. Left sinus exhibits no frontal sinus tenderness.  Mouth/Throat: No oropharyngeal exudate or posterior oropharyngeal erythema.  Neck: No Brudzinski's sign noted.  Pulmonary/Chest: Breath sounds normal. No respiratory distress.  Lymphadenopathy:       Head (right side): No preauricular adenopathy present.       Head (left side): No preauricular adenopathy present.       Right cervical: No superficial cervical adenopathy present.  Left cervical: No superficial cervical adenopathy present.    No results found for: HGBA1C  Lab Results  Component Value Date   WBC 5.7 02/12/2010   HGB 14.9 02/12/2010   HCT 44.2 02/12/2010   PLT 340 02/12/2010   GLUCOSE 101* 02/12/2010   NA 136 02/12/2010   K 3.8 02/12/2010   CL 108 02/12/2010   CREATININE 0.89 02/12/2010   BUN 15 02/12/2010   CO2 24 02/12/2010    No results found.  Assessment & Plan:   [name] was seen today for sinusitis and cough.  Diagnoses and all orders for this  visit:  Acute rhinosinusitis -     betamethasone acetate-betamethasone sodium phosphate (CELESTONE) injection 6 mg; Inject 1 mL (6 mg total) into the muscle once.  Episodic tension-type headache, not intractable -     betamethasone acetate-betamethasone sodium phosphate (CELESTONE) injection 6 mg; Inject 1 mL (6 mg total) into the muscle once.  Other orders -     Discontinue: betamethasone (CELESTONE) 0.6 MG/5ML solution; Take 10 mLs (1.2 mg total) by mouth 2 (two) times daily. -     levofloxacin (LEVAQUIN) 500 MG tablet; Take 1 tablet (500 mg total) by mouth daily. -     Pseudoephedrine-Guaifenesin [phone] MG TB12; Take 1 tablet by mouth 2 (two) times daily. For congeestion   I have discontinued Michael Price amoxicillin-clavulanate, predniSONE, and betamethasone. I am also having him start on levofloxacin and Pseudoephedrine-Guaifenesin. Additionally, I am having him maintain his Venlafaxine HCl and clonazePAM. We administered betamethasone acetate-betamethasone sodium phosphate.  Meds ordered this encounter  Medications  . DISCONTD: betamethasone (CELESTONE) 0.6 MG/5ML solution    Sig: Take 10 mLs (1.2 mg total) by mouth 2 (two) times daily.    Dispense:  118 mL    Refill:  0  . betamethasone acetate-betamethasone sodium phosphate (CELESTONE) injection 6 mg    Sig:   . levofloxacin (LEVAQUIN) 500 MG tablet    Sig: Take 1 tablet (500 mg total) by mouth daily.    Dispense:  7 tablet    Refill:  0  . Pseudoephedrine-Guaifenesin [phone] MG TB12    Sig: Take 1 tablet by mouth 2 (two) times daily. For congeestion    Dispense:  20 each    Refill:  0     Follow-up: Return if symptoms worsen or fail to improve.  Mechele Claude, M.D.

## 2015-04-26 ENCOUNTER — Other Ambulatory Visit: Payer: Self-pay | Admitting: Family Medicine

## 2015-04-26 NOTE — Telephone Encounter (Signed)
Last filled 03/30/15, last seen 03/12/15. Nurse call in at [facility]

## 2015-04-27 ENCOUNTER — Telehealth: Payer: Self-pay | Admitting: Family Medicine

## 2015-04-27 ENCOUNTER — Ambulatory Visit: Payer: 59 | Admitting: Family Medicine

## 2015-04-27 NOTE — Telephone Encounter (Signed)
Pt no showed for med refill appt Per Dr Darlyn Read, Klonopin will not be refilled until pt comes in for follow up

## 2015-04-27 NOTE — Telephone Encounter (Signed)
Pt no showed for appt today for med refill  Notified pt that he must keep follow up appts for narcotic refill He will call back to schedule appt

## 2015-04-29 ENCOUNTER — Encounter: Payer: Self-pay | Admitting: Family Medicine

## 2015-04-29 ENCOUNTER — Ambulatory Visit (INDEPENDENT_AMBULATORY_CARE_PROVIDER_SITE_OTHER): Payer: 59 | Admitting: Family Medicine

## 2015-04-29 VITALS — BP 116/84 | HR 87 | Temp 98.2°F | Ht 72.0 in | Wt 223.0 lb

## 2015-04-29 DIAGNOSIS — F410 Panic disorder [episodic paroxysmal anxiety] without agoraphobia: Secondary | ICD-10-CM

## 2015-04-29 MED ORDER — CLONAZEPAM 1 MG PO TABS: 1.0000 mg | ORAL_TABLET | Freq: Two times a day (BID) | ORAL | Status: DC | PRN

## 2015-04-29 MED ORDER — VENLAFAXINE HCL ER 150 MG PO CP24: 150.0000 mg | ORAL_CAPSULE | Freq: Every day | ORAL | Status: DC

## 2015-04-29 NOTE — Progress Notes (Signed)
Subjective:  Patient ID: Michael Price, male    DOB: 01/14/1976  Age: 39 y.o. MRN: [id number]  CC: Medication Refill   HPI Michael Price presents for Pt. Has spells when he starts thinking of things and gets worked up. Denies Dyspnea but has to take deep breaths. Mom has to talk him down. Work, family and pressure plus payments to ex- wife after divorce cause him to start ruminating. Then he gets upset, stressed and then anxiety will occur. Klonopin helps him not have the spells  History Michael Price has a past medical history of Mastoiditis of left side and Anxiety.   He has past surgical history that includes Mastoidectomy revision; othropedic; arm surgery; and Ear Cyst Excision.   His family history is not on file.He reports that he has quit smoking. His smoking use included Cigarettes. He has a .04 pack-year smoking history. He has never used smokeless tobacco. He reports that he drinks alcohol. He reports that he does not use illicit drugs.  Outpatient Prescriptions Prior to Visit  Medication Sig Dispense Refill  . clonazePAM (KLONOPIN) 1 MG tablet Take 1 tablet (1 mg total) by mouth 2 (two) times daily as needed for anxiety. 60 tablet 0  . Venlafaxine HCl 37.5 MG TB24 1 qday with food X 7d, then 2 qdayX 7, then 3 q day X 7, then 4 q day 82 each 1  . levofloxacin (LEVAQUIN) 500 MG tablet Take 1 tablet (500 mg total) by mouth daily. 7 tablet 0  . Pseudoephedrine-Guaifenesin [phone] MG TB12 Take 1 tablet by mouth 2 (two) times daily. For congeestion 20 each 0   No facility-administered medications prior to visit.    ROS Review of Systems  Constitutional: Negative for fever, chills and diaphoresis.  HENT: Negative for congestion, rhinorrhea and sore throat.   Respiratory: Negative for cough, shortness of breath and wheezing.   Cardiovascular: Negative for chest pain.  Gastrointestinal: Negative for nausea, vomiting, abdominal pain, diarrhea, constipation and abdominal distention.    Genitourinary: Negative for dysuria and frequency.  Musculoskeletal: Negative for joint swelling and arthralgias.  Skin: Negative for rash.  Neurological: Negative for headaches.    Objective:  BP 116/84 mmHg  Pulse 87  Temp(Src) 98.2 F (36.8 C) (Oral)  Ht 6' (1.829 m)  Wt 223 lb (101.152 kg)  BMI 30.24 kg/m2  BP Readings from Last 3 Encounters:  04/29/15 116/84  04/11/15 142/87  03/30/15 119/88    Wt Readings from Last 3 Encounters:  04/29/15 223 lb (101.152 kg)  04/11/15 236 lb 12.8 oz (107.412 kg)  03/30/15 223 lb (101.152 kg)     Physical Exam  Constitutional: He is oriented to person, place, and time. He appears well-developed and well-nourished. No distress.  HENT:  Head: Normocephalic and atraumatic.  Right Ear: External ear normal.  Left Ear: External ear normal.  Nose: Nose normal.  Mouth/Throat: Oropharynx is clear and moist.  Eyes: Conjunctivae and EOM are normal. Pupils are equal, round, and reactive to light.  Neck: Normal range of motion. Neck supple. No thyromegaly present.  Cardiovascular: Normal rate, regular rhythm and normal heart sounds.   No murmur heard. Pulmonary/Chest: Effort normal and breath sounds normal. No respiratory distress. He has no wheezes. He has no rales.  Abdominal: Soft. Bowel sounds are normal. He exhibits no distension. There is no tenderness.  Lymphadenopathy:    He has no cervical adenopathy.  Neurological: He is alert and oriented to person, place, and time. He has normal reflexes.  Skin: Skin is warm and dry.  Psychiatric: He has a normal mood and affect. His behavior is normal. Judgment and thought content normal.    No results found for: HGBA1C  Lab Results  Component Value Date   WBC 5.7 02/12/2010   HGB 14.9 02/12/2010   HCT 44.2 02/12/2010   PLT 340 02/12/2010   GLUCOSE 101* 02/12/2010   NA 136 02/12/2010   K 3.8 02/12/2010   CL 108 02/12/2010   CREATININE 0.89 02/12/2010   BUN 15 02/12/2010   CO2 24  02/12/2010    No results found.  Assessment & Plan:   Michael Price was seen today for medication refill.  Diagnoses and all orders for this visit:  Panic anxiety syndrome  Other orders -     venlafaxine XR (EFFEXOR-XR) 150 MG 24 hr capsule; Take 1 capsule (150 mg total) by mouth daily with breakfast. -     clonazePAM (KLONOPIN) 1 MG tablet; Take 1 tablet (1 mg total) by mouth 2 (two) times daily as needed for anxiety.  I have discontinued Mr. Leasure Venlafaxine HCl, levofloxacin, and Pseudoephedrine-Guaifenesin. I am also having him start on venlafaxine XR. Additionally, I am having him maintain his clonazePAM.  Meds ordered this encounter  Medications  . venlafaxine XR (EFFEXOR-XR) 150 MG 24 hr capsule    Sig: Take 1 capsule (150 mg total) by mouth daily with breakfast.    Dispense:  30 capsule    Refill:  5  . clonazePAM (KLONOPIN) 1 MG tablet    Sig: Take 1 tablet (1 mg total) by mouth 2 (two) times daily as needed for anxiety.    Dispense:  60 tablet    Refill:  5     Follow-up: Return in about 6 months (around 10/27/2015).  Mechele Claude, M.D.

## 2015-07-27 ENCOUNTER — Telehealth: Payer: Self-pay | Admitting: Family Medicine

## 2015-07-27 NOTE — Telephone Encounter (Signed)
appt made

## 2015-08-02 ENCOUNTER — Ambulatory Visit: Payer: 59

## 2015-08-26 ENCOUNTER — Ambulatory Visit: Payer: 59

## 2015-08-30 ENCOUNTER — Encounter: Payer: Self-pay | Admitting: Family Medicine

## 2015-09-02 ENCOUNTER — Ambulatory Visit: Payer: Self-pay | Admitting: Family Medicine

## 2015-09-02 ENCOUNTER — Ambulatory Visit (INDEPENDENT_AMBULATORY_CARE_PROVIDER_SITE_OTHER): Payer: BLUE CROSS/BLUE SHIELD | Admitting: Family Medicine

## 2015-09-02 ENCOUNTER — Encounter: Payer: Self-pay | Admitting: Family Medicine

## 2015-09-02 VITALS — BP 141/89 | HR 84 | Temp 97.0°F | Ht 72.0 in | Wt 251.2 lb

## 2015-09-02 DIAGNOSIS — J0121 Acute recurrent ethmoidal sinusitis: Secondary | ICD-10-CM | POA: Diagnosis not present

## 2015-09-02 MED ORDER — HYDROCODONE-ACETAMINOPHEN 5-300 MG PO TABS: 1.0000 | ORAL_TABLET | Freq: Three times a day (TID) | ORAL | Status: DC | PRN

## 2015-09-02 MED ORDER — AMOXICILLIN-POT CLAVULANATE 875-125 MG PO TABS: 1.0000 | ORAL_TABLET | Freq: Two times a day (BID) | ORAL | Status: DC

## 2015-09-02 NOTE — Progress Notes (Signed)
   Subjective:    Patient ID: Michael Price, male    DOB: 03-03-1976, 40 y.o.   MRN: [id number]  HPI 40 year old gentleman with long history of left ear problems. 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 Today he presents with left ear pain as well as headache sinus pain and pressure.  There are no active problems to display for this patient.  Outpatient Encounter Prescriptions as of 09/02/2015  Medication Sig  . venlafaxine XR (EFFEXOR-XR) 150 MG 24 hr capsule Take 1 capsule (150 mg total) by mouth daily with breakfast.  . clonazePAM (KLONOPIN) 1 MG tablet Take 1 tablet (1 mg total) by mouth 2 (two) times daily as needed for anxiety. (Patient not taking: Reported on 09/02/2015)   No facility-administered encounter medications on file as of 09/02/2015.      Review of Systems  Constitutional: Negative.   HENT: Positive for congestion, ear discharge and facial swelling.   Respiratory: Negative.   Cardiovascular: Negative.   Neurological: Positive for headaches.       Objective:   Physical Exam  Constitutional: He appears well-developed and well-nourished.  HENT:  Head: Normocephalic.  Right tympanic membrane is dull left external canal has some exudate and is tender. Patient will not allow good exam. There is tenderness around the year in the mastoid area as well.          Assessment & Plan:  1. Acute recurrent ethmoidal sinusitis 8 with Augmentin. Encouraged to use Mucinex. Encour[facility] to [facility] into this month for follow-up. We discussed use of Augmentin versus Levaquin and he understands and would rather use Augmentin prior to Levaquin.  Frederica KusterStephen M Tomica Arseneault MD

## 2015-09-05 ENCOUNTER — Telehealth: Payer: Self-pay | Admitting: Family Medicine

## 2015-09-06 NOTE — Telephone Encounter (Signed)
I do not believe patient is given Augmentin enough time. I note he wants Levaquin if he insists begin Levaquin 500 mg daily

## 2015-09-06 NOTE — Telephone Encounter (Signed)
Stp and he decided he would just wait and give the Augmentin a little more time.

## 2015-09-19 ENCOUNTER — Encounter: Payer: Self-pay | Admitting: Family Medicine

## 2015-09-19 ENCOUNTER — Ambulatory Visit (INDEPENDENT_AMBULATORY_CARE_PROVIDER_SITE_OTHER): Payer: BLUE CROSS/BLUE SHIELD | Admitting: Family Medicine

## 2015-09-19 VITALS — BP 151/106 | HR 87 | Temp 96.7°F | Ht 72.0 in | Wt 251.0 lb

## 2015-09-19 DIAGNOSIS — J029 Acute pharyngitis, unspecified: Secondary | ICD-10-CM | POA: Diagnosis not present

## 2015-09-19 LAB — POCT RAPID STREP A (OFFICE): Rapid Strep A Screen: NEGATIVE

## 2015-09-19 MED ORDER — VENLAFAXINE HCL ER 150 MG PO CP24: 150.0000 mg | ORAL_CAPSULE | Freq: Every day | ORAL | Status: DC

## 2015-09-19 MED ORDER — PREDNISONE 20 MG PO TABS: ORAL_TABLET | ORAL | Status: DC

## 2015-09-19 MED ORDER — CLONAZEPAM 1 MG PO TABS: 1.0000 mg | ORAL_TABLET | Freq: Two times a day (BID) | ORAL | Status: DC | PRN

## 2015-09-19 NOTE — Progress Notes (Signed)
   Subjective:    Patient ID: Michael Price, male    DOB: 03-Oct-1975, 40 y.o.   MRN: [id number]  HPI Patient here today for sore throat, ear pain and sinus pressure. He has already finished one antibiotic for this. This seems to be a chronic problem with ears and throat and sinuses. He still has appointment in [place] that has been moved back to February. He did report some improvement on Augmentin within developed a sore throat. There is been no fever headache.  Of note blood pressure is elevated today. He has lots of excuses why it might be. He ran out of his anxiety medicine, he feels bad, and he is worried about his sore throat and ear symptoms.      There are no active problems to display for this patient.  Outpatient Encounter Prescriptions as of 09/19/2015  Medication Sig  . clonazePAM (KLONOPIN) 1 MG tablet Take 1 tablet (1 mg total) by mouth 2 (two) times daily as needed for anxiety.  . Hydrocodone-Acetaminophen (VICODIN) 5-300 MG TABS Take 1 tablet by mouth 3 (three) times daily as needed.  . venlafaxine XR (EFFEXOR-XR) 150 MG 24 hr capsule Take 1 capsule (150 mg total) by mouth daily with breakfast.  . [DISCONTINUED] amoxicillin-clavulanate (AUGMENTIN) 875-125 MG tablet Take 1 tablet by mouth 2 (two) times daily.   No facility-administered encounter medications on file as of 09/19/2015.      Review of Systems  Constitutional: Negative.   HENT: Positive for congestion, ear pain (left ) and sore throat.   Eyes: Negative.   Respiratory: Positive for cough.   Cardiovascular: Negative.   Gastrointestinal: Negative.   Endocrine: Negative.   Genitourinary: Negative.   Musculoskeletal: Negative.   Skin: Negative.   Allergic/Immunologic: Negative.   Neurological: Negative.   Hematological: Negative.   Psychiatric/Behavioral: Negative.        Objective:   Physical Exam  Constitutional: He appears well-developed and well-nourished.  HENT:  Right Ear: External ear  normal.  Left external canal is full of debris and cerumen Throat is moderately injected without exudate There is no significant adenopathy Sinuses are nontender  Pulmonary/Chest: Effort normal and breath sounds normal.          Assessment & Plan:  1. Sore throat  - POCT rapid strep A  2. Acute pharyngitis, unspecified etiology Rapid strep is negative. This is likely a viral etiology. I tried to explain why antibiotics are not appropriate. We'll try a short course of prednisone 40 mg for 2 days and then 20 mg for 2 days. Did refill Effexor and clonazepam today Also suggested he monitor blood pressure to see if today's reading is real or artifact  Frederica Kuster MD

## 2015-09-20 ENCOUNTER — Telehealth: Payer: Self-pay | Admitting: Family Medicine

## 2015-09-20 MED ORDER — HYDROCODONE-ACETAMINOPHEN 5-300 MG PO TABS: 1.0000 | ORAL_TABLET | Freq: Three times a day (TID) | ORAL | Status: DC | PRN

## 2015-09-20 NOTE — Telephone Encounter (Signed)
Rx refilled per patient request 

## 2015-09-20 NOTE — Telephone Encounter (Signed)
Pt aware rx was refilled per request and ready for him to pick up

## 2015-09-28 ENCOUNTER — Ambulatory Visit (INDEPENDENT_AMBULATORY_CARE_PROVIDER_SITE_OTHER): Payer: BLUE CROSS/BLUE SHIELD | Admitting: Family Medicine

## 2015-09-28 VITALS — BP 142/97 | HR 86 | Temp 98.1°F | Ht 73.83 in | Wt 249.2 lb

## 2015-09-28 DIAGNOSIS — J0101 Acute recurrent maxillary sinusitis: Secondary | ICD-10-CM | POA: Diagnosis not present

## 2015-09-28 DIAGNOSIS — J029 Acute pharyngitis, unspecified: Secondary | ICD-10-CM

## 2015-09-28 LAB — POCT RAPID STREP A (OFFICE): RAPID STREP A SCREEN: NEGATIVE

## 2015-09-28 MED ORDER — LEVOFLOXACIN 500 MG PO TABS: 500.0000 mg | ORAL_TABLET | Freq: Every day | ORAL | Status: DC

## 2015-09-28 MED ORDER — IBUPROFEN 800 MG PO TABS: 800.0000 mg | ORAL_TABLET | Freq: Three times a day (TID) | ORAL | Status: DC | PRN

## 2015-09-28 NOTE — Progress Notes (Signed)
   HPI  Patient presents today with recurrent sinusitis.  Patient explains that over the last several weeks she's had persistent sore throat, bilateral maxillary facial pain, and ear pain. 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 He describes severe bilateral facial pain, teeth pain, and bilateral ear pain Assessment unhelped by Augmentin and 5 mg of hydrocodone.    PMH: Smoking status noted ROS: Per HPI  Objective: BP 142/97 mmHg  Pulse 86  Temp(Src) 98.1 F (36.7 C) (Oral)  Ht 6' 1.83" (1.875 m)  Wt 249 lb 3.2 oz (113.036 kg)  BMI 32.15 kg/m2 Gen: NAD, alert, cooperative with exam HEENT: NCAT, left TM not visualized due to cerumen and tenderness, right TM normal, bilateral maxillary sinus tenderness to palpation CV: RRR, good S1/S2, no murmur Resp: CTABL, no wheezes, non-labored Ext: No edema, warm Neuro: Alert and oriented, No gross deficits  Assessment and plan:  # 2 maxillary sinusitis Treat with Levaquin Encouraged follow-up with his ENT considering that he has a very long history of sinusitis. Return to clinic if worsening or does not improve as expected Offered steroids which he declines    Orders Placed This Encounter  Procedures  . Culture, Group A Strep    Order Specific Question:  Source    Answer:  throat  . POCT rapid strep A    Meds ordered this encounter  Medications  . levofloxacin (LEVAQUIN) 500 MG tablet    Sig: Take 1 tablet (500 mg total) by mouth daily.    Dispense:  7 tablet    Refill:  0    Murtis Sink, MD [place] [facility] 09/28/2015, 6:10 PM

## 2015-09-28 NOTE — Addendum Note (Signed)
Addended by: Elenora Gamma on: 09/28/2015 06:13 PM   Modules accepted: Orders, SmartSet

## 2015-09-28 NOTE — Patient Instructions (Signed)
Great to meet you!  I have prescribed Levaquin for your sinusitis  Please be sure to see Dr. Jena Gauss as scheduled.    Sinusitis, Adult Sinusitis is redness, soreness, and inflammation of the paranasal sinuses. Paranasal sinuses are air pockets within the bones of your face. They are located beneath your eyes, in the middle of your forehead, and above your eyes. In healthy paranasal sinuses, mucus is able to drain out, and air is able to circulate through them by way of your nose. However, when your paranasal sinuses are inflamed, mucus and air can become trapped. This can allow bacteria and other germs to grow and cause infection. Sinusitis can develop quickly and last only a short time (acute) or continue over a long period (chronic). Sinusitis that lasts for more than 12 weeks is considered chronic. CAUSES Causes of sinusitis include:  Allergies.  Structural abnormalities, such as displacement of the cartilage that separates your nostrils (deviated septum), which can decrease the air flow through your nose and sinuses and affect sinus drainage.  Functional abnormalities, such as when the small hairs (cilia) that line your sinuses and help remove mucus do not work properly or are not present. SIGNS AND SYMPTOMS Symptoms of acute and chronic sinusitis are the same. The primary symptoms are pain and pressure around the affected sinuses. Other symptoms include:  Upper toothache.  Earache.  Headache.  Bad breath.  Decreased sense of smell and taste.  A cough, which worsens when you are lying flat.  Fatigue.  Fever.  Thick drainage from your nose, which often is green and may contain pus (purulent).  Swelling and warmth over the affected sinuses. DIAGNOSIS Your health care provider will perform a physical exam. During your exam, your health care provider may perform any of the following to help determine if you have acute sinusitis or chronic sinusitis:  Look in your nose for  signs of abnormal growths in your nostrils (nasal polyps).  Tap over the affected sinus to check for signs of infection.  View the inside of your sinuses using an imaging device that has a light attached (endoscope). If your health care provider suspects that you have chronic sinusitis, one or more of the following tests may be recommended:  Allergy tests.  Nasal culture. A sample of mucus is taken from your nose, sent to a lab, and screened for bacteria.  Nasal cytology. A sample of mucus is taken from your nose and examined by your health care provider to determine if your sinusitis is related to an allergy. TREATMENT Most cases of acute sinusitis are related to a viral infection and will resolve on their own within 10 days. Sometimes, medicines are prescribed to help relieve symptoms of both acute and chronic sinusitis. These may include pain medicines, decongestants, nasal steroid sprays, or saline sprays. However, for sinusitis related to a bacterial infection, your health care provider will prescribe antibiotic medicines. These are medicines that will help kill the bacteria causing the infection. Rarely, sinusitis is caused by a fungal infection. In these cases, your health care provider will prescribe antifungal medicine. For some cases of chronic sinusitis, surgery is needed. Generally, these are cases in which sinusitis recurs more than 3 times per year, despite other treatments. HOME CARE INSTRUCTIONS  Drink plenty of water. Water helps thin the mucus so your sinuses can drain more easily.  Use a humidifier.  Inhale steam 3-4 times a day (for example, sit in the bathroom with the shower running).  Apply a  warm, moist washcloth to your face 3-4 times a day, or as directed by your health care provider.  Use saline nasal sprays to help moisten and clean your sinuses.  Take medicines only as directed by your health care provider.  If you were prescribed either an antibiotic or  antifungal medicine, finish it all even if you start to feel better. SEEK IMMEDIATE MEDICAL CARE IF:  You have increasing pain or severe headaches.  You have nausea, vomiting, or drowsiness.  You have swelling around your face.  You have vision problems.  You have a stiff neck.  You have difficulty breathing.   This information is not intended to replace advice given to you by your health care provider. Make sure you discuss any questions you have with your health care provider.   Document Released: 08/13/2005 Document Revised: 09/03/2014 Document Reviewed: 08/28/2011 Elsevier Interactive Patient Education [employer]

## 2015-10-02 LAB — CULTURE, GROUP A STREP

## 2015-10-11 ENCOUNTER — Ambulatory Visit: Payer: BLUE CROSS/BLUE SHIELD | Admitting: Family Medicine

## 2015-10-13 ENCOUNTER — Ambulatory Visit (INDEPENDENT_AMBULATORY_CARE_PROVIDER_SITE_OTHER): Payer: BLUE CROSS/BLUE SHIELD | Admitting: Family Medicine

## 2015-10-13 ENCOUNTER — Encounter: Payer: Self-pay | Admitting: Family Medicine

## 2015-10-13 VITALS — BP 136/97 | HR 92 | Temp 97.1°F | Ht 73.0 in | Wt 248.6 lb

## 2015-10-13 DIAGNOSIS — R51 Headache: Secondary | ICD-10-CM

## 2015-10-13 DIAGNOSIS — R519 Headache, unspecified: Secondary | ICD-10-CM | POA: Insufficient documentation

## 2015-10-13 DIAGNOSIS — G4452 New daily persistent headache (NDPH): Secondary | ICD-10-CM

## 2015-10-13 MED ORDER — HYDROCODONE-ACETAMINOPHEN 5-300 MG PO TABS: 1.0000 | ORAL_TABLET | Freq: Four times a day (QID) | ORAL | Status: DC | PRN

## 2015-10-13 NOTE — Progress Notes (Signed)
   Subjective:    Patient ID: Michael Price, male    DOB: 1976-04-29, 40 y.o.   MRN: [id number]  HPI 40 year old gentleman with chronic ear and sinus complaints. Allegedly had surgery for cholesteatoma in the past. Now he has daily headaches. He has been treated with multiple courses of antibiotics. If his symptoms do really represent sinus infections recurrent he might need to be checked for an immune deficiency problem. He is requesting medicine for headache today but assures me he is not seeking drugs.  There are no active problems to display for this patient.  Outpatient Encounter Prescriptions as of 10/13/2015  Medication Sig  . clonazePAM (KLONOPIN) 1 MG tablet Take 1 tablet (1 mg total) by mouth 2 (two) times daily as needed for anxiety.  Marland Kitchen ibuprofen (ADVIL,MOTRIN) 800 MG tablet Take 1 tablet (800 mg total) by mouth every 8 (eight) hours as needed.  . venlafaxine XR (EFFEXOR-XR) 150 MG 24 hr capsule Take 1 capsule (150 mg total) by mouth daily with breakfast.  . [DISCONTINUED] levofloxacin (LEVAQUIN) 500 MG tablet Take 1 tablet (500 mg total) by mouth daily.   No facility-administered encounter medications on file as of 10/13/2015.      Review of Systems  Constitutional: Negative.   HENT: Positive for congestion.   Respiratory: Positive for cough.   Cardiovascular: Negative.   Neurological: Positive for headaches.       Objective:   Physical Exam  Constitutional: He is oriented to person, place, and time. He appears well-developed and well-nourished.  HENT:  Tympanic membranes are dull  Cardiovascular: Normal rate and regular rhythm.   Pulmonary/Chest: Effort normal. He has wheezes.  Neurological: He is alert and oriented to person, place, and time.          Assessment & Plan:  1. Frequent headaches Rule out sinus disease. If positive will seek ENT referral. If negative consider evaluation at headache clinic. We'll also check immunoglobulins to rule out some immune  deficiency - CT Head Wo Contrast; Future  Frederica Kuster MD

## 2015-10-14 NOTE — Addendum Note (Signed)
Addended by: Prescott Gum on: 10/14/2015 01:04 PM   Modules accepted: Orders, SmartSet

## 2015-10-15 LAB — SPECIMEN STATUS

## 2015-10-17 LAB — IGG, IGA, IGM
IgA/Immunoglobulin A, Serum: 260 mg/dL (ref 90–386)
IgG (Immunoglobin G), Serum: 994 mg/dL (ref 700–1600)
IgM (Immunoglobulin M), Srm: 100 mg/dL (ref 20–172)

## 2015-10-19 ENCOUNTER — Telehealth: Payer: Self-pay | Admitting: Family Medicine

## 2015-10-19 DIAGNOSIS — G4452 New daily persistent headache (NDPH): Secondary | ICD-10-CM

## 2015-10-19 NOTE — Telephone Encounter (Signed)
Gave 30 on 10/13/15, referral

## 2015-10-19 NOTE — Telephone Encounter (Signed)
Please place referral to [facility] off [facility] or [facility]. No refill at this time verbal order per Dr. Hyacinth Meeker.

## 2015-10-20 NOTE — Telephone Encounter (Signed)
Pt aware referral has been placed and no refill at this time

## 2015-10-21 ENCOUNTER — Encounter: Payer: Self-pay | Admitting: *Deleted

## 2015-10-24 ENCOUNTER — Encounter (HOSPITAL_COMMUNITY): Payer: Self-pay | Admitting: *Deleted

## 2015-10-24 ENCOUNTER — Emergency Department (HOSPITAL_COMMUNITY)
Admission: EM | Admit: 2015-10-24 | Discharge: 2015-10-24 | Disposition: A | Discharge status: Home / Self Care | Payer: BLUE CROSS/BLUE SHIELD | Attending: Emergency Medicine | Admitting: Emergency Medicine

## 2015-10-24 DIAGNOSIS — F419 Anxiety disorder, unspecified: Secondary | ICD-10-CM | POA: Diagnosis not present

## 2015-10-24 DIAGNOSIS — S161XXA Strain of muscle, fascia and tendon at neck level, initial encounter: Secondary | ICD-10-CM | POA: Diagnosis not present

## 2015-10-24 DIAGNOSIS — Y9389 Activity, other specified: Secondary | ICD-10-CM | POA: Insufficient documentation

## 2015-10-24 DIAGNOSIS — R000 Tachycardia, unspecified: Secondary | ICD-10-CM | POA: Insufficient documentation

## 2015-10-24 DIAGNOSIS — Z87891 Personal history of nicotine dependence: Secondary | ICD-10-CM | POA: Diagnosis not present

## 2015-10-24 DIAGNOSIS — S39012A Strain of muscle, fascia and tendon of lower back, initial encounter: Secondary | ICD-10-CM

## 2015-10-24 DIAGNOSIS — Y990 Civilian activity done for income or pay: Secondary | ICD-10-CM | POA: Diagnosis not present

## 2015-10-24 DIAGNOSIS — Z8669 Personal history of other diseases of the nervous system and sense organs: Secondary | ICD-10-CM | POA: Diagnosis not present

## 2015-10-24 DIAGNOSIS — S3991XA Unspecified injury of abdomen, initial encounter: Secondary | ICD-10-CM | POA: Diagnosis not present

## 2015-10-24 DIAGNOSIS — X58XXXA Exposure to other specified factors, initial encounter: Secondary | ICD-10-CM | POA: Diagnosis not present

## 2015-10-24 DIAGNOSIS — Y9289 Other specified places as the place of occurrence of the external cause: Secondary | ICD-10-CM | POA: Diagnosis not present

## 2015-10-24 DIAGNOSIS — Z79899 Other long term (current) drug therapy: Secondary | ICD-10-CM | POA: Diagnosis not present

## 2015-10-24 DIAGNOSIS — S3992XA Unspecified injury of lower back, initial encounter: Secondary | ICD-10-CM | POA: Diagnosis present

## 2015-10-24 MED ORDER — VENLAFAXINE HCL ER 150 MG PO CP24: 150.0000 mg | ORAL_CAPSULE | Freq: Every day | ORAL | Status: DC

## 2015-10-24 MED ORDER — CLONAZEPAM 1 MG PO TABS: 1.0000 mg | ORAL_TABLET | Freq: Two times a day (BID) | ORAL | Status: DC | PRN

## 2015-10-24 MED ORDER — IBUPROFEN 800 MG PO TABS: 800.0000 mg | ORAL_TABLET | Freq: Three times a day (TID) | ORAL | Status: DC

## 2015-10-24 MED ORDER — LORAZEPAM 1 MG PO TABS
1.0000 mg | ORAL_TABLET | Freq: Once | ORAL | Status: AC
  Administered 2015-10-24: 1 mg via ORAL
  Filled 2015-10-24: qty 1

## 2015-10-24 MED ORDER — IBUPROFEN 800 MG PO TABS
800.0000 mg | ORAL_TABLET | Freq: Once | ORAL | Status: AC
  Administered 2015-10-24: 800 mg via ORAL
  Filled 2015-10-24: qty 1

## 2015-10-24 NOTE — Discharge Instructions (Signed)
Muscle Strain A muscle strain (pulled muscle) happens when a muscle is stretched beyond normal length. It happens when a sudden, violent force stretches your muscle too far. Usually, a few of the fibers in your muscle are torn. Muscle strain is common in athletes. Recovery usually takes 1-2 weeks. Complete healing takes 5-6 weeks.  HOME CARE   Follow the PRICE method of treatment to help your injury get better. Do this the first 2-3 days after the injury:  Protect. Protect the muscle to keep it from getting injured again.  Rest. Limit your activity and rest the injured body part.  Ice. Put ice in a plastic bag. Place a towel between your skin and the bag. Then, apply the ice and leave it on from 15-20 minutes each hour. After the third day, switch to moist heat packs.  Compression. Use a splint or elastic bandage on the injured area for comfort. Do not put it on too tightly.  Elevate. Keep the injured body part above the level of your heart.  Only take medicine as told by your doctor.  Warm up before doing exercise to prevent future muscle strains. GET HELP IF:   You have more pain or puffiness (swelling) in the injured area.  You feel numbness, tingling, or notice a loss of strength in the injured area. MAKE SURE YOU:   Understand these instructions.  Will watch your condition.  Will get help right away if you are not doing well or get worse.   This information is not intended to replace advice given to you by your health care provider. Make sure you discuss any questions you have with your health care provider.   Document Released: 05/22/2008 Document Revised: 06/03/2013 Document Reviewed: 03/12/2013 Elsevier Interactive Patient Education 2016 [employer]  Generalized Anxiety Disorder Generalized anxiety disorder (GAD) is a mental disorder. It interferes with life functions, including relationships, work, and school. GAD is different from normal anxiety, which everyone  experiences at some point in their lives in response to specific life events and activities. Normal anxiety actually helps [place] prepare for and get through these life events and activities. Normal anxiety goes away after the event or activity is over.  GAD causes anxiety that is not necessarily related to specific events or activities. It also causes excess anxiety in proportion to specific events or activities. The anxiety associated with GAD is also difficult to control. GAD can vary from mild to severe. People with severe GAD can have intense waves of anxiety with physical symptoms (panic attacks).  SYMPTOMS The anxiety and worry associated with GAD are difficult to control. This anxiety and worry are related to many life events and activities and also occur more days than not for 6 months or longer. People with GAD also have three or more of the following symptoms (one or more in children):  Restlessness.   Fatigue.  Difficulty concentrating.   Irritability.  Muscle tension.  Difficulty sleeping or unsatisfying sleep. DIAGNOSIS GAD is diagnosed through an assessment by your health care provider. Your health care provider will ask you questions aboutyour mood,physical symptoms, and events in your life. Your health care provider may ask you about your medical history and use of alcohol or drugs, including prescription medicines. Your health care provider may also do a physical exam and blood tests. Certain medical conditions and the use of certain substances can cause symptoms similar to those associated with GAD. Your health care provider may refer you to a mental health specialist  for further evaluation. TREATMENT The following therapies are usually used to treat GAD:   Medication. Antidepressant medication usually is prescribed for long-term daily control. Antianxiety medicines may be added in severe cases, especially when panic attacks occur.   Talk therapy (psychotherapy). Certain  types of talk therapy can be helpful in treating GAD by providing support, education, and guidance. A form of talk therapy called cognitive behavioral therapy can teach you healthy ways to think about and react to daily life events and activities.  Stress managementtechniques. These include yoga, meditation, and exercise and can be very helpful when they are practiced regularly. A mental health specialist can help determine which treatment is best for you. Some people see improvement with one therapy. However, other people require a combination of therapies.   This information is not intended to replace advice given to you by your health care provider. Make sure you discuss any questions you have with your health care provider.   Document Released: 12/08/2012 Document Revised: 09/03/2014 Document Reviewed: 12/08/2012 Elsevier Interactive Patient Education [employer]

## 2015-10-24 NOTE — ED Notes (Signed)
Pt states he was a work when he "pulled" something in his back. This occurred at few days ago. Pt denies any other symptoms.

## 2015-10-24 NOTE — ED Provider Notes (Signed)
CSN: [id number]     Arrival date & time 10/24/15  0759 History   First MD Initiated Contact with Patient 10/24/15 0802     No chief complaint on file.    (Consider location/radiation/quality/duration/timing/severity/associated sxs/prior Treatment) HPI   Michael Price is a 40 y.o. male who presents to the Emergency Department complaining of low back pain and anxiety.  He reports hx of anxiety which he feels has been exacerbated by a recent divorce and he has been out of his medication for a little over two weeks.  He states that he no longer has a PCP and was told that it may take two weeks or longer for an appt with behavioral health and has been taking Klonopin and Effexor.   Denies SI or HI.  He also c/o generalized upper abd lower back pain for two days.  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  He describes the pain as "soreness" and it's worsened by certain movements and bending.  Improves at rest.  He denies fever, chills, numbness or weakness of the LE's, abdominal pain, urine or bowel changes.  He states that he can no longer see his current PCP due to them not accepting his current insurance.    Past Medical History  Diagnosis Date  . Mastoiditis of left side   . Anxiety    Past Surgical History  Procedure Laterality Date  . Mastoidectomy revision    . Othropedic    . Arm surgery    . Ear cyst excision     No family history on file. Social History  Substance Use Topics  . Smoking status: Former Smoker -- 0.02 packs/day for 2 years    Types: Cigarettes  . Smokeless tobacco: Never Used  . Alcohol Use: No     Comment: occasional    Review of Systems  Constitutional: Negative for fever.  Respiratory: Negative for shortness of breath.   Gastrointestinal: Negative for vomiting, abdominal pain and constipation.  Genitourinary: Negative for dysuria, hematuria, flank pain, decreased urine volume  and difficulty urinating.  Musculoskeletal: Positive for back pain. Negative for joint swelling.  Skin: Negative for rash.  Neurological: Negative for weakness and numbness.  Psychiatric/Behavioral: Negative for suicidal ideas and confusion. The patient is nervous/anxious.   All other systems reviewed and are negative.     Allergies  Review of patient's allergies indicates no known allergies.  Home Medications   Prior to Admission medications   Medication Sig Start Date End Date Taking? Authorizing Provider  clonazePAM (KLONOPIN) 1 MG tablet Take 1 tablet (1 mg total) by mouth 2 (two) times daily as needed for anxiety. 09/19/15   Frederica Kuster, MD  Hydrocodone-Acetaminophen 5-300 MG TABS Take 1 tablet by mouth 4 (four) times daily as needed. 10/13/15   Frederica Kuster, MD  ibuprofen (ADVIL,MOTRIN) 800 MG tablet Take 1 tablet (800 mg total) by mouth every 8 (eight) hours as needed. 09/28/15   Elenora Gamma, MD  venlafaxine XR (EFFEXOR-XR) 150 MG 24 hr capsule Take 1 capsule (150 mg total) by mouth daily with breakfast. 09/19/15   Frederica Kuster, MD   BP 133/102 mmHg  Pulse 112  Temp(Src) 98.3 F (36.8 C) (Oral)  Resp 20  Ht 6' (1.829 m)  Wt 102.059 kg  BMI 30.51 kg/m2  SpO2 100% Physical Exam  Constitutional: He is oriented to person, place, and time. He appears well-developed  and well-nourished. No distress.  HENT:  Head: Normocephalic and atraumatic.  Neck: Normal range of motion. Neck supple.  Cardiovascular: Normal rate, regular rhythm, normal heart sounds and intact distal pulses.   No murmur heard. Pulmonary/Chest: Effort normal and breath sounds normal. No respiratory distress.  Abdominal: Soft. He exhibits no distension. There is no tenderness.  Musculoskeletal: He exhibits tenderness. He exhibits no edema.       Lumbar back: He exhibits tenderness and pain. He exhibits normal range of motion, no swelling, no deformity, no laceration and normal pulse.  Diffuse  ttp of the cervical and lumbar paraspinal muscles.  No spinal tenderness, no step-offs.  DP pulses are brisk and symmetrical.  Distal sensation intact.  Pt has 5/5 strength against resistance of bilateral lower extremities.     Neurological: He is alert and oriented to person, place, and time. He has normal strength. No sensory deficit. He exhibits normal muscle tone. Coordination and gait normal.  Reflex Scores:      Patellar reflexes are 2+ on the right side and 2+ on the left side.      Achilles reflexes are 2+ on the right side and 2+ on the left side. Skin: Skin is warm and dry. No rash noted.  Psychiatric: He has a normal mood and affect. His speech is normal. He expresses no homicidal and no suicidal ideation. He expresses no suicidal plans and no homicidal plans.  Nursing note and vitals reviewed.   ED Course  Procedures (including critical care time) Labs Review Labs Reviewed - No data to display  Imaging Review No results found. I have personally reviewed and evaluated these images and lab results as part of my medical decision-making.   EKG Interpretation None      MDM   Final diagnoses:  Lumbar strain, initial encounter  Cervical strain, initial encounter  Anxiety disorder, unspecified anxiety disorder type    Pt is well appearing,  No focal neuro deficits,  Ambulates with a steady gait.  No concerning sx's for emergent neurological or infectious process.  Denies SI or HI.  According to pt, he does not currently have a PCP (although seen at [facility] on 10/13/15) and last rx for his anxiety medications was Sept with [facility] and was given 5 refills.    He is mildly anxious.  Mildly tachycardic, possible withdrawal, no hx of seizures.  Symptoms improved after po Ativan.  He appears stable for d/c and I have advised him that he will need to establish a new PCP or return to behavioral health for long term management of his anxiety.    Pauline Aus, PA-C 10/25/15  2050  Blane Ohara, MD 10/26/15 [phone]

## 2015-11-07 ENCOUNTER — Encounter (INDEPENDENT_AMBULATORY_CARE_PROVIDER_SITE_OTHER): Payer: Self-pay

## 2015-11-07 ENCOUNTER — Encounter: Payer: Self-pay | Admitting: Family

## 2015-11-07 ENCOUNTER — Ambulatory Visit (INDEPENDENT_AMBULATORY_CARE_PROVIDER_SITE_OTHER): Payer: BLUE CROSS/BLUE SHIELD | Admitting: Family

## 2015-11-07 VITALS — BP 130/89 | HR 97 | Temp 97.1°F | Ht 70.0 in | Wt 254.2 lb

## 2015-11-07 DIAGNOSIS — H66005 Acute suppurative otitis media without spontaneous rupture of ear drum, recurrent, left ear: Secondary | ICD-10-CM | POA: Diagnosis not present

## 2015-11-07 DIAGNOSIS — H7102 Cholesteatoma of attic, left ear: Secondary | ICD-10-CM | POA: Diagnosis not present

## 2015-11-07 DIAGNOSIS — J0100 Acute maxillary sinusitis, unspecified: Secondary | ICD-10-CM | POA: Diagnosis not present

## 2015-11-07 MED ORDER — FLUTICASONE PROPIONATE 50 MCG/ACT NA SUSP: 2.0000 | Freq: Every day | NASAL | Status: DC

## 2015-11-07 MED ORDER — AMOXICILLIN-POT CLAVULANATE 875-125 MG PO TABS: 1.0000 | ORAL_TABLET | Freq: Two times a day (BID) | ORAL | Status: DC

## 2015-11-07 NOTE — Patient Instructions (Addendum)
Otitis Media, Adult  Otitis media is redness, soreness, and inflammation of the middle ear. Otitis media may be caused by allergies or, most commonly, by infection. Often it occurs as a complication of the common cold.  SIGNS AND SYMPTOMS  Symptoms of otitis media may include:   Earache.   Fever.   Ringing in your ear.   Headache.   Leakage of fluid from the ear.  DIAGNOSIS  To diagnose otitis media, your health care provider will examine your ear with an otoscope. This is an instrument that allows your health care provider to see into your ear in order to examine your eardrum. Your health care provider also will ask you questions about your symptoms.  TREATMENT   Typically, otitis media resolves on its own within 3-5 days. Your health care provider may prescribe medicine to ease your symptoms of pain. If otitis media does not resolve within 5 days or is recurrent, your health care provider may prescribe antibiotic medicines if he or she suspects that a bacterial infection is the cause.  HOME CARE INSTRUCTIONS    If you were prescribed an antibiotic medicine, finish it all even if you start to feel better.   Take medicines only as directed by your health care provider.   Keep all follow-up visits as directed by your health care provider.  SEEK MEDICAL CARE IF:   You have otitis media only in one ear, or bleeding from your nose, or both.   You notice a lump on your neck.   You are not getting better in 3-5 days.   You feel worse instead of better.  SEEK IMMEDIATE MEDICAL CARE IF:    You have pain that is not controlled with medicine.   You have swelling, redness, or pain around your ear or stiffness in your neck.   You notice that part of your face is paralyzed.   You notice that the bone behind your ear (mastoid) is tender when you touch it.  MAKE SURE YOU:    Understand these instructions.   Will watch your condition.   Will get help right away if you are not doing well or get worse.     This  information is not intended to replace advice given to you by your health care provider. Make sure you discuss any questions you have with your health care provider.     Document Released: 05/18/2004 Document Revised: 09/03/2014 Document Reviewed: 03/10/2013  Elsevier Interactive Patient Education 2016 [employer]  Sinusitis, Adult  Sinusitis is redness, soreness, and inflammation of the paranasal sinuses. Paranasal sinuses are air pockets within the bones of your face. They are located beneath your eyes, in the middle of your forehead, and above your eyes. In healthy paranasal sinuses, mucus is able to drain out, and air is able to circulate through them by way of your nose. However, when your paranasal sinuses are inflamed, mucus and air can become trapped. This can allow bacteria and other germs to grow and cause infection.  Sinusitis can develop quickly and last only a short time (acute) or continue over a long period (chronic). Sinusitis that lasts for more than 12 weeks is considered chronic.  CAUSES  Causes of sinusitis include:   Allergies.   Structural abnormalities, such as displacement of the cartilage that separates your nostrils (deviated septum), which can decrease the air flow through your nose and sinuses and affect sinus drainage.   Functional abnormalities, such as when the small hairs (cilia) that   toothache.  Earache.  Headache.  Bad breath.  Decreased sense of smell and taste.  A cough, which worsens when you are lying flat.  Fatigue.  Fever.  Thick drainage from your nose, which often is green and may contain pus (purulent).  Swelling and warmth over the affected sinuses. DIAGNOSIS Your health care provider will  perform a physical exam. During your exam, your health care provider may perform any of the following to help determine if you have acute sinusitis or chronic sinusitis:  Look in your nose for signs of abnormal growths in your nostrils (nasal polyps).  Tap over the affected sinus to check for signs of infection.  View the inside of your sinuses using an imaging device that has a light attached (endoscope). If your health care provider suspects that you have chronic sinusitis, one or more of the following tests may be recommended:  Allergy tests.  Nasal culture. A sample of mucus is taken from your nose, sent to a lab, and screened for bacteria.  Nasal cytology. A sample of mucus is taken from your nose and examined by your health care provider to determine if your sinusitis is related to an allergy. TREATMENT Most cases of acute sinusitis are related to a viral infection and will resolve on their own within 10 days. Sometimes, medicines are prescribed to help relieve symptoms of both acute and chronic sinusitis. These may include pain medicines, decongestants, nasal steroid sprays, or saline sprays. However, for sinusitis related to a bacterial infection, your health care provider will prescribe antibiotic medicines. These are medicines that will help kill the bacteria causing the infection. Rarely, sinusitis is caused by a fungal infection. In these cases, your health care provider will prescribe antifungal medicine. For some cases of chronic sinusitis, surgery is needed. Generally, these are cases in which sinusitis recurs more than 3 times per year, despite other treatments. HOME CARE INSTRUCTIONS  Drink plenty of water. Water helps thin the mucus so your sinuses can drain more easily.  Use a humidifier.  Inhale steam 3-4 times a day (for example, sit in the bathroom with the shower running).  Apply a warm, moist washcloth to your face 3-4 times a day, or as directed by your health care  provider.  Use saline nasal sprays to help moisten and clean your sinuses.  Take medicines only as directed by your health care provider.  If you were prescribed either an antibiotic or antifungal medicine, finish it all even if you start to feel better. SEEK IMMEDIATE MEDICAL CARE IF:  You have increasing pain or severe headaches.  You have nausea, vomiting, or drowsiness.  You have swelling around your face.  You have vision problems.  You have a stiff neck.  You have difficulty breathing.   This information is not intended to replace advice given to you by your health care provider. Make sure you discuss any questions you have with your health care provider.   Document Released: 08/13/2005 Document Revised: 09/03/2014 Document Reviewed: 08/28/2011 Elsevier Interactive Patient Education 2016 [employer]  - Take meds as prescribed - Use a cool mist humidifier  -Use saline nose sprays frequently -Saline irrigations of the nose can be very helpful if done frequently.  * 4X daily for 1 week*  * Use of a nettie pot can be helpful with this. Follow directions with this* -Force fluids -For any cough or congestion  Use plain Mucinex- regular strength or max strength is fine   * Children- consult with  Pharmacist for dosing -For fever or aces or pains- take tylenol or ibuprofen appropriate for age and weight.  * for fevers greater than 101 orally you may alternate ibuprofen and tylenol every  3 hours. -Throat lozenges if help -New toothbrush in 3 days   Jannifer Rodneyhristy Hawks, FNP

## 2015-11-07 NOTE — Progress Notes (Signed)
Subjective:    Patient ID: Michael Price, male    DOB: 06/24/1976, 40 y.o.   MRN: [id number]  HPI Pt presents to the office today with left ear pain that is causing him to have headaches. PT states he has had several surgeries in the past on his left ear for cholesteatoma. Pt states he PCP has sent him to see a neurologists for a migraines, but the patient states he does not believe he is having migraines. Pt states he believes his left ear is very painful and is causing his headaches. PT states his insurance has changed and has not been able to see his ENT doctor in over a year. Pt states he is having constant pain 9-10 out 10. Pt states he is draining a clear "stinky fluid".    Review of Systems  Constitutional: Negative.   HENT: Negative.   Respiratory: Negative.   Cardiovascular: Negative.   Gastrointestinal: Negative.   Endocrine: Negative.   Genitourinary: Negative.   Musculoskeletal: Negative.   Neurological: Negative.   Hematological: Negative.   Psychiatric/Behavioral: Negative.   All other systems reviewed and are negative.      Objective:   Physical Exam  Constitutional: He is oriented to person, place, and time. He appears well-developed and well-nourished. No distress.  HENT:  Head: Normocephalic.  Right Ear: External ear normal.  Left Ear: There is drainage, swelling and tenderness. Left ear erythematous TM: unalble to visualize reltated to drainage and pain   Nose: Right sinus exhibits maxillary sinus tenderness and frontal sinus tenderness. Left sinus exhibits maxillary sinus tenderness and frontal sinus tenderness.  Nasal passage erythemas with moderate swelling Oropharynx erythemas    Eyes: Pupils are equal, round, and reactive to light. Right eye exhibits no discharge. Left eye exhibits no discharge.  Neck: Normal range of motion. Neck supple. No thyromegaly present.  Cardiovascular: Normal rate, regular rhythm, normal heart sounds and intact distal  pulses.   No murmur heard. Pulmonary/Chest: Effort normal and breath sounds normal. No respiratory distress. He has no wheezes.  Abdominal: Soft. Bowel sounds are normal. He exhibits no distension. There is no tenderness.  Musculoskeletal: Normal range of motion. He exhibits no edema or tenderness.  Neurological: He is alert and oriented to person, place, and time. He has normal reflexes. No cranial nerve deficit.  Skin: Skin is warm and dry. No rash noted. No erythema.  Psychiatric: He has a normal mood and affect. His behavior is normal. Judgment and thought content normal.  Vitals reviewed.    BP 130/89 mmHg  Pulse 97  Temp(Src) 97.1 F (36.2 C) (Oral)  Ht  (1.778 m)  Wt 254 lb 3.2 oz (115.304 kg)  BMI 36.47 kg/m2      Assessment & Plan:  1. Cholesteatoma of attic, left - Ambulatory referral to ENT  2. Recurrent acute suppurative otitis media without spontaneous rupture of left tympanic membrane - Ambulatory referral to ENT - amoxicillin-clavulanate (AUGMENTIN) 875-125 MG tablet; Take 1 tablet by mouth 2 (two) times daily.  Dispense: 20 tablet; Refill: 0  3. Acute maxillary sinusitis, recurrence not specified -- Take meds as prescribed - Use a cool mist humidifier  -Use saline nose sprays frequently -Saline irrigations of the nose can be very helpful if done frequently.  * 4X daily for 1 week*  * Use of a nettie pot can be helpful with this. Follow directions with this* -Force fluids -For any cough or congestion  Use plain Mucinex- regular strength or max  strength is fine   * Children- consult with Pharmacist for dosing -For fever or aces or pains- take tylenol or ibuprofen appropriate for age and weight.  * for fevers greater than 101 orally you may alternate ibuprofen and tylenol every  3 hours. -Throat lozenges if help - Ambulatory referral to ENT - fluticasone (FLONASE) 50 MCG/ACT nasal spray; Place 2 sprays into both nostrils daily.  Dispense: 16 g; Refill:  6 - amoxicillin-clavulanate (AUGMENTIN) 875-125 MG tablet; Take 1 tablet by mouth 2 (two) times daily.  Dispense: 20 tablet; Refill: 0  *Pt needs to make ENT appt!!  Jannifer Rodneyhristy Zeppelin Beckstrand, FNP

## 2015-11-10 ENCOUNTER — Ambulatory Visit (INDEPENDENT_AMBULATORY_CARE_PROVIDER_SITE_OTHER): Payer: BLUE CROSS/BLUE SHIELD

## 2015-11-10 ENCOUNTER — Ambulatory Visit (INDEPENDENT_AMBULATORY_CARE_PROVIDER_SITE_OTHER): Payer: BLUE CROSS/BLUE SHIELD | Admitting: Family Medicine

## 2015-11-10 ENCOUNTER — Other Ambulatory Visit: Payer: Self-pay | Admitting: Family Medicine

## 2015-11-10 ENCOUNTER — Encounter: Payer: Self-pay | Admitting: Family Medicine

## 2015-11-10 VITALS — BP 151/104 | HR 105 | Temp 97.7°F | Ht 70.0 in | Wt 250.4 lb

## 2015-11-10 DIAGNOSIS — R52 Pain, unspecified: Secondary | ICD-10-CM | POA: Diagnosis not present

## 2015-11-10 DIAGNOSIS — M25512 Pain in left shoulder: Secondary | ICD-10-CM | POA: Diagnosis not present

## 2015-11-10 MED ORDER — HYDROCODONE-ACETAMINOPHEN 5-300 MG PO TABS
1.0000 | ORAL_TABLET | Freq: Four times a day (QID) | ORAL | Status: DC | PRN
Start: 2015-11-10 — End: 2016-02-22
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
## 2015-11-15 ENCOUNTER — Encounter: Payer: Self-pay | Admitting: *Deleted

## 2016-02-22 ENCOUNTER — Encounter: Payer: Self-pay | Admitting: Family Medicine

## 2016-02-22 ENCOUNTER — Ambulatory Visit (INDEPENDENT_AMBULATORY_CARE_PROVIDER_SITE_OTHER): Payer: BLUE CROSS/BLUE SHIELD | Admitting: Family Medicine

## 2016-02-22 VITALS — BP 108/78 | HR 91 | Temp 98.2°F | Ht 70.0 in | Wt 240.4 lb

## 2016-02-22 DIAGNOSIS — F4323 Adjustment disorder with mixed anxiety and depressed mood: Secondary | ICD-10-CM

## 2016-02-22 MED ORDER — CLONAZEPAM 1 MG PO TABS: 1.0000 mg | ORAL_TABLET | Freq: Two times a day (BID) | ORAL | Status: DC | PRN

## 2016-02-22 NOTE — Progress Notes (Signed)
   Subjective:    Patient ID: Michael Price, male    DOB: March 01, 1976, 40 y.o.   MRN: [id number]  HPI patient presents today requesting refill on his clonazepam. He tells me that he is going to lose his insurance for 2 months beginning 5271 and then it will be reinstated September 1. I am unclear as to why this is happening. He also had a prescription for Effexor but tells me that that causes GI problems and he has not been taking it and does not want that refilled as was originally thought.  He denies any recent problems with ears or sinus. We have been seeing him frequently for those problems as well as for pain medication for those problems as well as migraine. Fortunately, the symptoms to see him to be in the past  Patient Active Problem List   Diagnosis Date Noted  . Headache 10/13/2015   Outpatient Encounter Prescriptions as of 02/22/2016  Medication Sig  . clonazePAM (KLONOPIN) 1 MG tablet Take 1 tablet (1 mg total) by mouth 2 (two) times daily as needed for anxiety.  Marland Kitchen. venlafaxine XR (EFFEXOR XR) 150 MG 24 hr capsule Take 1 capsule (150 mg total) by mouth daily with breakfast.  . [DISCONTINUED] amoxicillin-clavulanate (AUGMENTIN) 875-125 MG tablet Take 1 tablet by mouth 2 (two) times daily.  . [DISCONTINUED] fluticasone (FLONASE) 50 MCG/ACT nasal spray Place 2 sprays into both nostrils daily.  . [DISCONTINUED] Hydrocodone-Acetaminophen 5-300 MG TABS Take 1 tablet by mouth 4 (four) times daily as needed.   No facility-administered encounter medications on file as of 02/22/2016.      Review of Systems  Constitutional: Negative.   Respiratory: Negative.   Cardiovascular: Negative.   Psychiatric/Behavioral: The patient is nervous/anxious.        Objective:   Physical Exam  Constitutional: He is oriented to person, place, and time. He appears well-developed and well-nourished.  HENT:  Head: Normocephalic.  Right Ear: External ear normal.  Left Ear: External ear normal.    Mouth/Throat: Oropharynx is clear and moist.  Neurological: He is alert and oriented to person, place, and time.  Psychiatric: He has a normal mood and affect. His behavior is normal.   BP 108/78 mmHg  Pulse 91  Temp(Src) 98.2 F (36.8 C) (Oral)  Ht 5\' 10"  (1.778 m)  Wt 240 lb 6.4 oz (109.045 kg)  BMI 34.49 kg/m2        Assessment & Plan:  1. Adjustment disorder with mixed anxiety and depressed mood Feel Klonopin 1 mg twice a day #60 with 1 refill. He needs to return first of September for follow-up  Frederica KusterStephen M Miller MD

## 2016-03-25 ENCOUNTER — Emergency Department (HOSPITAL_COMMUNITY)
Admission: EM | Admit: 2016-03-25 | Discharge: 2016-03-25 | Disposition: A | Discharge status: Home / Self Care | Payer: BLUE CROSS/BLUE SHIELD | Attending: Emergency Medicine | Admitting: Emergency Medicine

## 2016-03-25 ENCOUNTER — Encounter (HOSPITAL_COMMUNITY): Payer: Self-pay

## 2016-03-25 DIAGNOSIS — Z79899 Other long term (current) drug therapy: Secondary | ICD-10-CM | POA: Insufficient documentation

## 2016-03-25 DIAGNOSIS — Z9119 Patient's noncompliance with other medical treatment and regimen: Secondary | ICD-10-CM | POA: Insufficient documentation

## 2016-03-25 DIAGNOSIS — Z87891 Personal history of nicotine dependence: Secondary | ICD-10-CM | POA: Insufficient documentation

## 2016-03-25 DIAGNOSIS — F419 Anxiety disorder, unspecified: Secondary | ICD-10-CM | POA: Insufficient documentation

## 2016-03-25 DIAGNOSIS — Z9114 Patient's other noncompliance with medication regimen: Secondary | ICD-10-CM

## 2016-03-25 LAB — COMPREHENSIVE METABOLIC PANEL
ALBUMIN: 4.5 g/dL (ref 3.5–5.0)
ALK PHOS: 75 U/L (ref 38–126)
ALT: 60 U/L (ref 17–63)
ANION GAP: 9 (ref 5–15)
AST: 47 U/L — ABNORMAL HIGH (ref 15–41)
BUN: 14 mg/dL (ref 6–20)
CALCIUM: 9.3 mg/dL (ref 8.9–10.3)
CHLORIDE: 103 mmol/L (ref 101–111)
CO2: 21 mmol/L — AB (ref 22–32)
Creatinine, Ser: 0.94 mg/dL (ref 0.61–1.24)
GFR calc non Af Amer: >60 mL/min (ref >60)
GLUCOSE: 95 mg/dL (ref 65–99)
Potassium: 3.2 mmol/L — ABNORMAL LOW (ref 3.5–5.1)
SODIUM: 133 mmol/L — AB (ref 135–145)
Total Bilirubin: 1.1 mg/dL (ref 0.3–1.2)
Total Protein: 7.9 g/dL (ref 6.5–8.1)

## 2016-03-25 LAB — CBC
HEMATOCRIT: 46.8 % (ref 39.0–52.0)
HEMOGLOBIN: 15.9 g/dL (ref 13.0–17.0)
MCH: 31.4 pg (ref 26.0–34.0)
MCHC: 34 g/dL (ref 30.0–36.0)
MCV: 92.3 fL (ref 78.0–100.0)
PLATELETS: 353 10*3/uL (ref 150–400)
RBC: 5.07 MIL/uL (ref 4.22–5.81)
RDW: 12.9 % (ref 11.5–15.5)
WBC: 7.5 10*3/uL (ref 4.0–10.5)

## 2016-03-25 LAB — RAPID URINE DRUG SCREEN, HOSP PERFORMED
AMPHETAMINES: NOT DETECTED
BARBITURATES: NOT DETECTED
BENZODIAZEPINES: NOT DETECTED
Cocaine: NOT DETECTED
Opiates: NOT DETECTED
TETRAHYDROCANNABINOL: NOT DETECTED

## 2016-03-25 LAB — ETHANOL: Alcohol, Ethyl (B): <5 mg/dL (ref <5)

## 2016-03-25 MED ORDER — POTASSIUM CHLORIDE CRYS ER 20 MEQ PO TBCR
40.0000 meq | EXTENDED_RELEASE_TABLET | Freq: Once | ORAL | Status: AC
  Administered 2016-03-25: 40 meq via ORAL
  Filled 2016-03-25: qty 2

## 2016-03-25 NOTE — ED Notes (Signed)
Pt states his "head is all messed up". Per mom the pt takes an entire month of klonopin in 1 week. Pt states he needs klonopin to function and has been hearing music he doesn't think is there. 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

## 2016-03-25 NOTE — ED Notes (Addendum)
Mother here with patient and requesting behavioral health assistance. She states son very anxious and thinks taking pills. Mother concerned that now patient not working or sleeping due to his anxiety
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
## 2016-03-25 NOTE — ED Provider Notes (Signed)
[facility] Provider Note   CSN: [id number] Arrival date & time: 03/25/16  1044  First Provider Contact:  First MD Initiated Contact with Patient 03/25/16 1254        History   Chief Complaint Chief Complaint  Patient presents with  . Dizziness  . Anxiety    HPI Michael Price is a 40 y.o. male.  40 year old male presents with his mother for concerns about his use of Clonopin and also his mental health. The patient has been on Klonopin for 10 years or more. 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 He is prescribed a month's worth and will use it all in one week. She reports that he "eats it ". He states that he can't function without the Clonopin. He says that without it he becomes paranoid and is not able to function. He said he has problems with anxiety and panic and that the Clonopin helps to control his symptoms but that he has required more and more of the medication in order for to work. He would like help as he is not able to hold a job or to have any sort of quality of life with the way he is living right now. He denies any suicidal or homicidal ideation. He also has other more vague complaints. He said all of these seem related to his use of his medications.      Past Medical History:  Diagnosis Date  . Anxiety   . Mastoiditis of left side     Patient Active Problem List   Diagnosis Date Noted  . Headache 10/13/2015    Past Surgical History:  Procedure Laterality Date  . arm surgery    . EAR CYST EXCISION    . MASTOIDECTOMY REVISION    . othropedic         Home Medications    Prior to Admission medications   Medication Sig Start Date End Date Taking? Authorizing Provider  citalopram (CELEXA) 20 MG tablet Take 20 mg by mouth daily.   Yes Historical Provider, MD  citalopram (CELEXA) 40 MG tablet Take 40 mg by mouth daily.   Yes Historical Provider, MD  clonazePAM (KLONOPIN) 1 MG tablet Take 1 tablet (1 mg  total) by mouth 2 (two) times daily as needed for anxiety. 02/22/16  Yes Frederica Kuster, MD  venlafaxine XR (EFFEXOR XR) 150 MG 24 hr capsule Take 1 capsule (150 mg total) by mouth daily with breakfast. 10/24/15  Yes Tammy Triplett, PA-C    Family History No family history on file.  Social History Social History  Substance Use Topics  . Smoking status: Former Smoker    Packs/day: 0.02    Years: 2.00    Types: Cigarettes  . Smokeless tobacco: Never Used  . Alcohol use No     Comment: occasional     Allergies   Review of patient's allergies indicates no known allergies.   Review of Systems Review of Systems  Constitutional: Negative for appetite change, chills, fatigue and fever.  HENT: Negative for congestion, rhinorrhea, sinus pressure and sore throat.   Eyes: Negative for visual disturbance.  Respiratory: Negative for cough, chest tightness, shortness of breath and wheezing.   Cardiovascular: Negative for chest pain.  Gastrointestinal: Negative for abdominal pain, diarrhea, nausea and vomiting.  Genitourinary: Negative for dysuria, flank pain and urgency.  Musculoskeletal: Negative for back pain and myalgias.  Skin: Negative for rash.  Neurological: Negative for dizziness, weakness,  light-headedness, numbness and headaches.  Hematological: Does not bruise/bleed easily.  Psychiatric/Behavioral: Negative for confusion, self-injury and suicidal ideas. The patient is nervous/anxious and is hyperactive.      Physical Exam Updated Vital Signs BP 150/95 (BP Location: Right Arm)   Pulse 92   Temp 99 F (37.2 C) (Oral)   Resp 16   Ht 5\' 11"  (1.803 m)   Wt 236 lb (107 kg)   SpO2 96%   BMI 32.92 kg/m   Physical Exam  Constitutional: He is oriented to person, place, and time. He appears well-developed and well-nourished. No distress.  HENT:  Head: Normocephalic and atraumatic.  Right Ear: External ear normal.  Left Ear: External ear normal.  Mouth/Throat: Oropharynx  is clear and moist. No oropharyngeal exudate.  Eyes: EOM are normal. Pupils are equal, round, and reactive to light.  Neck: Normal range of motion. Neck supple.  Cardiovascular: Normal rate, regular rhythm, normal heart sounds and intact distal pulses.   No murmur heard. Pulmonary/Chest: Effort normal. No respiratory distress. He has no wheezes. He has no rales.  Abdominal: Soft. He exhibits no distension. There is no tenderness.  Musculoskeletal: He exhibits no edema.  Neurological: He is alert and oriented to person, place, and time. He has normal strength. No cranial nerve deficit or sensory deficit. Coordination and gait normal.  Skin: Skin is warm and dry. No rash noted. He is not diaphoretic.  Vitals reviewed.    ED Treatments / Results  Labs (all labs ordered are listed, but only abnormal results are displayed) Labs Reviewed  COMPREHENSIVE METABOLIC PANEL - Abnormal; Notable for the following:       Result Value   Sodium 133 (*)    Potassium 3.2 (*)    CO2 21 (*)    AST 47 (*)    All other components within normal limits  ETHANOL  CBC  URINE RAPID DRUG SCREEN, HOSP PERFORMED    EKG  EKG Interpretation  Date/Time:  Sunday March 25 2016 10:52:55 EDT Ventricular Rate:  107 PR Interval:  160 QRS Duration: 82 QT Interval:  334 QTC Calculation: 445 R Axis:   80 Text Interpretation:  Sinus tachycardia Otherwise normal ECG No previous ECGs available Confirmed by Journii Nierman ([id number]) on 03/25/2016 12:47:15 PM       Radiology No results found.  Procedures Procedures (including critical care time)  Medications Ordered in ED Medications  potassium chloride SA (K-DUR,KLOR-CON) CR tablet 40 mEq (40 mEq Oral Given 03/25/16 1500)     Initial Impression / Assessment and Plan / ED Course  I have reviewed the triage vital signs and the nursing notes.  Pertinent labs & imaging results that were available during my care of the patient were reviewed by me and considered in  my medical decision making (see chart for details).  Clinical Course  Patient was seen and evaluated in stable condition. 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  He is also reporting symptoms of anxiety. He has not been able to hold a job or been functioning well outpatient. He is accompanied by his mother. I agree with his mother that it appears that he is abusing his prescription. TTS was consult it and they recommended outpatient treatment. They faxed over resources for the patient. I discussed TTS's recommendations with the patient and his mother at length. They expressed understanding and agreement with plan of care. Patient was discharged home in stable condition. He was provided with a list of resources. He was encouraged  to discuss with his primary care physician the way he has been abusing his prescription for Klonipin.  Final Clinical Impressions(s) / ED Diagnoses   Final diagnoses:  Anxiety  Noncompliance with medication treatment due to abuse of medication    New Prescriptions New Prescriptions   No medications on file     Leta Baptist, MD 03/25/16 1524

## 2016-03-25 NOTE — BH Assessment (Signed)
Tele Assessment Note   Michael Price is a 40 y.o. male with a history of anxiety who presented voluntarily to [facility] with concerns that his anxiety is growing worse.  Per report, Pt ingested a month's worth of Klonopin over the course of 10 days to ease anxiety.  Pt reported as follows:  This year has been difficult -- he has been through several jobs and had to move in with his Michael Price and Michael Price.  Pt reported that he is prescribed Klonopin, Ativan, and Effexor through a PCP to treat the following anxiety symptoms:  Episodic panic attacks, racing thoughts, jitteriness, poor sleep and poor appetite.  Pt reported that his anxiety has grown worse recently.  "I feel bad, out of my head crazy."  Pt denied suicidal ideation, homicidal ideation, or self-injury.  He reported that he sometimes hears "music" in his left ear, but he also indicated that he had surgery on that ear, and the music he experiences may be a byproduct of the surgery.  Pt denied any substance use.  Pt does not receive any therapeutic or psychiatric treatment.  When asked how he would like to proceed today, Pt said he wanted help.  Pt was cooperative and calm during assessment.  He had good eye contact and was dressed in street clothes.  He appeared appropriately groomed.  Pt's speech was normal in rate, rhythm, and volume.  Thought processes were within normal range.  Thought content was organized and goal-oriented.  Pt's memory and concentration were intact.  There was no evidence of delusion.  Pt's insight and judgment were fair.  Impulse control was poor as evidenced by his mis-use of Klonopin.  Consulted L. Earlene Plater, NP, who determined that Pt does not meet inpatient criteria.  Recommended outpatient treatment.  Faxed to attending nurse a list of outpatient providers in the area.  Diagnosis: Anxiety Disorder, Unspecified  Past Medical History:  Past Medical History:  Diagnosis Date  . Anxiety   . Mastoiditis of left side     Past  Surgical History:  Procedure Laterality Date  . arm surgery    . EAR CYST EXCISION    . MASTOIDECTOMY REVISION    . othropedic      Family History: No family history on file.  Social History:  reports that he has quit smoking. His smoking use included Cigarettes. He has a 0.04 pack-year smoking history. He has never used smokeless tobacco. He reports that he does not drink alcohol or use drugs.  Additional Social History:  Alcohol / Drug Use Pain Medications: See PTA Prescriptions: See PTA Over the Counter: See PTA History of alcohol / drug use?: No history of alcohol / drug abuse (Pt denied)  CIWA: CIWA-Ar BP: 143/67 Pulse Rate: 110 COWS:    PATIENT STRENGTHS: (choose at least two) Average or above average intelligence Capable of independent living Communication skills Motivation for treatment/growth  Allergies: No Known Allergies  Home Medications:  (Not in a hospital admission)  OB/GYN Status:  No LMP for male patient.  General Assessment Data Location of Assessment: [facility] ED TTS Assessment: In system Is this a Tele or Face-to-Face Assessment?: Tele Assessment Is this an Initial Assessment or a Re-assessment for this encounter?: Initial Assessment Marital status: Single Living Arrangements: Parent (with Michael Price and Michael Price) Can pt return to current living arrangement?: Yes Admission Status: Voluntary Is patient capable of signing voluntary admission?: Yes Referral Source: Self/Family/Friend Insurance type: Self-pay  Medical Screening Exam [facility] Walk-in ONLY) Medical Exam completed: Yes  Crisis Care Plan Living Arrangements: Parent (with Michael Price and Michael Price) Name of Psychiatrist: None Name of Therapist: None  Education Status Is patient currently in school?: No  Risk to self with the past 6 months Suicidal Ideation: No Has patient been a risk to self within the past 6 months prior to admission? : Other (comment) Suicidal Intent: No Has patient had any  suicidal intent within the past 6 months prior to admission? : No Is patient at risk for suicide?: No Suicidal Plan?: No Has patient had any suicidal plan within the past 6 months prior to admission? : No Access to Means: No What has been your use of drugs/alcohol within the last 12 months?: Pt mis-using prescribed Klonipin and Ativan Previous Attempts/Gestures: No Intentional Self Injurious Behavior: None Family Suicide History: Unknown Recent stressful life event(s): Job Loss ("I've been through a couple of jobs") Persecutory voices/beliefs?: No Depression: Yes Depression Symptoms: Despondent, Insomnia (Poor appetite) Substance abuse history and/or treatment for substance abuse?: No Suicide prevention information given to non-admitted patients: Not applicable  Risk to Others within the past 6 months Homicidal Ideation: No Does patient have any lifetime risk of violence toward others beyond the six months prior to admission? : No Thoughts of Harm to Others: No Current Homicidal Intent: No Current Homicidal Plan: No Access to Homicidal Means: No History of harm to others?: No Assessment of Violence: None Noted Does patient have access to weapons?: No Criminal Charges Pending?: No Does patient have a court date: No Is patient on probation?: No  Psychosis Hallucinations: Auditory (Pt indicated he sometimes hears music in left ear -- see not) Delusions: None noted  Mental Status Report Appearance/Hygiene: Unremarkable (Street clothes) Eye Contact: Good Motor Activity: Unremarkable Speech: Unremarkable Level of Consciousness: Alert Mood: Anxious Affect: Appropriate to circumstance Anxiety Level: Severe (Pt indicated episodic panic attacks) Thought Processes: Coherent, Relevant Judgement: Partial Orientation: Place, Time, Situation, Person Obsessive Compulsive Thoughts/Behaviors: None  Cognitive Functioning Concentration: Good Memory: Recent Intact, Remote Intact IQ:  Average Insight: Fair Impulse Control: Poor Appetite: Poor Sleep: Decreased Vegetative Symptoms: None  ADLScreening [facility] Assessment Services) Patient's cognitive ability adequate to safely complete daily activities?: Yes Patient able to express need for assistance with ADLs?: Yes Independently performs ADLs?: Yes (appropriate for developmental age)  Prior Inpatient Therapy Prior Inpatient Therapy: No  Prior Outpatient Therapy Prior Outpatient Therapy: No Does patient have an ACCT team?: No Does patient have Intensive In-House Services?  : No Does patient have [facility] services? : No Does patient have P4CC services?: No  ADL Screening (condition at time of admission) Patient's cognitive ability adequate to safely complete daily activities?: Yes Is the patient deaf or have difficulty hearing?: No Does the patient have difficulty seeing, even when wearing glasses/contacts?: No Does the patient have difficulty concentrating, remembering, or making decisions?: No Patient able to express need for assistance with ADLs?: Yes Does the patient have difficulty dressing or bathing?: No Independently performs ADLs?: Yes (appropriate for developmental age) Does the patient have difficulty walking or climbing stairs?: No Weakness of Legs: None Weakness of Arms/Hands: None  Home Assistive Devices/Equipment Home Assistive Devices/Equipment: None  Therapy Consults (therapy consults require a physician order) PT Evaluation Needed: No OT Evalulation Needed: No SLP Evaluation Needed: No Abuse/Neglect Assessment (Assessment to be complete while patient is alone) Physical Abuse: Denies Verbal Abuse: Denies Sexual Abuse: Denies Exploitation of patient/patient's resources: Denies Self-Neglect: Denies Values / Beliefs Cultural Requests During Hospitalization: None Spiritual Requests During Hospitalization: None Consults Spiritual Care Consult  Needed: No Social Work Consult Needed:  No Merchant [employer] officer (For Healthcare) Does patient have an advance directive?: No Would patient like information on creating an advanced directive?: No - patient declined information    Additional Information 1:1 In Past 12 Months?: No CIRT Risk: No Elopement Risk: No Does patient have medical clearance?: Yes     Disposition:  Disposition Initial Assessment Completed for this Encounter: Yes Disposition of Patient: Outpatient treatment Type of outpatient treatment: Adult (Recommend discharge w/ f/u outpatient resources)  Earline Mayotte 03/25/2016 2:35 PM

## 2016-03-25 NOTE — ED Triage Notes (Addendum)
Patient here with dizziness and blurred vision for the past few weeks that has worsened. States that he also needs some mental health for his anxiety, has stopped his klonopin and effexor 3 weeks ago on his own, without medical direction. Denies suicidal and homicidal thoughts

## 2016-04-19 ENCOUNTER — Other Ambulatory Visit: Payer: Self-pay | Admitting: Family Medicine

## 2016-04-20 ENCOUNTER — Telehealth: Payer: Self-pay | Admitting: Family Medicine

## 2016-04-20 ENCOUNTER — Other Ambulatory Visit: Payer: Self-pay | Admitting: Family Medicine

## 2016-04-20 NOTE — Telephone Encounter (Signed)
Please advise 

## 2016-04-20 NOTE — Telephone Encounter (Signed)
[facility] pt. Last filled 03/22/16, last seen 02/22/16

## 2016-04-20 NOTE — Telephone Encounter (Signed)
[facility] to [facility]

## 2016-04-21 ENCOUNTER — Other Ambulatory Visit: Payer: Self-pay | Admitting: Family Medicine

## 2016-04-21 NOTE — Telephone Encounter (Signed)
Attempted to contact pt regarding RX Pt hung up due to person in the background screaming out that pt needs to go to rehab. Per [facility], pt refilled Clonazepam #[id number] on 04/12/2016

## 2016-04-22 ENCOUNTER — Emergency Department (HOSPITAL_COMMUNITY)
Admission: EM | Admit: 2016-04-22 | Discharge: 2016-04-22 | Disposition: A | Discharge status: Home / Self Care | Payer: BLUE CROSS/BLUE SHIELD | Attending: Emergency Medicine | Admitting: Emergency Medicine

## 2016-04-22 ENCOUNTER — Encounter (HOSPITAL_COMMUNITY): Payer: Self-pay | Admitting: *Deleted

## 2016-04-22 ENCOUNTER — Emergency Department (HOSPITAL_COMMUNITY): Payer: BLUE CROSS/BLUE SHIELD

## 2016-04-22 DIAGNOSIS — Y92410 Unspecified street and highway as the place of occurrence of the external cause: Secondary | ICD-10-CM | POA: Insufficient documentation

## 2016-04-22 DIAGNOSIS — S9032XA Contusion of left foot, initial encounter: Secondary | ICD-10-CM

## 2016-04-22 DIAGNOSIS — S91312A Laceration without foreign body, left foot, initial encounter: Secondary | ICD-10-CM | POA: Insufficient documentation

## 2016-04-22 DIAGNOSIS — Y939 Activity, unspecified: Secondary | ICD-10-CM | POA: Insufficient documentation

## 2016-04-22 DIAGNOSIS — Z87891 Personal history of nicotine dependence: Secondary | ICD-10-CM | POA: Insufficient documentation

## 2016-04-22 DIAGNOSIS — Y999 Unspecified external cause status: Secondary | ICD-10-CM | POA: Insufficient documentation

## 2016-04-22 DIAGNOSIS — Z23 Encounter for immunization: Secondary | ICD-10-CM | POA: Insufficient documentation

## 2016-04-22 MED ORDER — IBUPROFEN 400 MG PO TABS
800.0000 mg | ORAL_TABLET | Freq: Once | ORAL | Status: AC
  Administered 2016-04-22: 800 mg via ORAL
  Filled 2016-04-22: qty 2

## 2016-04-22 MED ORDER — NAPROXEN 500 MG PO TABS: 500.0000 mg | ORAL_TABLET | Freq: Two times a day (BID) | ORAL | 0 refills | Status: DC

## 2016-04-22 MED ORDER — TETANUS-DIPHTH-ACELL PERTUSSIS 5-2.5-18.5 LF-MCG/0.5 IM SUSP
0.5000 mL | Freq: Once | INTRAMUSCULAR | Status: AC
  Administered 2016-04-22: 0.5 mL via INTRAMUSCULAR
  Filled 2016-04-22: qty 0.5
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
## 2016-04-22 NOTE — ED Notes (Signed)
Patient transported to X-ray 

## 2016-04-23 ENCOUNTER — Emergency Department (HOSPITAL_COMMUNITY)
Admission: EM | Admit: 2016-04-23 | Discharge: 2016-04-23 | Disposition: A | Discharge status: Home / Self Care | Payer: BLUE CROSS/BLUE SHIELD | Attending: Emergency Medicine | Admitting: Emergency Medicine

## 2016-04-23 ENCOUNTER — Encounter (HOSPITAL_COMMUNITY): Payer: Self-pay | Admitting: *Deleted

## 2016-04-23 ENCOUNTER — Telehealth: Payer: Self-pay | Admitting: Family Medicine

## 2016-04-23 DIAGNOSIS — Z760 Encounter for issue of repeat prescription: Secondary | ICD-10-CM | POA: Insufficient documentation

## 2016-04-23 DIAGNOSIS — Z87891 Personal history of nicotine dependence: Secondary | ICD-10-CM | POA: Insufficient documentation

## 2016-04-23 DIAGNOSIS — F419 Anxiety disorder, unspecified: Secondary | ICD-10-CM | POA: Insufficient documentation

## 2016-04-23 MED ORDER — HYDROXYZINE HCL 25 MG PO TABS: 25.0000 mg | ORAL_TABLET | Freq: Three times a day (TID) | ORAL | 0 refills | Status: DC | PRN

## 2016-04-23 MED ORDER — CLONAZEPAM 1 MG PO TABS: 1.0000 mg | ORAL_TABLET | Freq: Two times a day (BID) | ORAL | 0 refills | Status: DC | PRN

## 2016-04-23 NOTE — Telephone Encounter (Signed)
Patient aware of medication refill and will need to be seen for any further refills.

## 2016-04-23 NOTE — ED Triage Notes (Signed)
Pt states he is very stressed out and wants a dose of his anxiety medication. Pt is out of him medication and needs enough for next Saturday when his PCP gets back from vacation.
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
## 2016-04-23 NOTE — Telephone Encounter (Signed)
Authorize 30 days only. Then contact the patient letting them know that they will need an appointment before any further prescriptions can be sent in. 

## 2016-05-22 ENCOUNTER — Other Ambulatory Visit: Payer: Self-pay | Admitting: Family Medicine

## 2016-05-22 ENCOUNTER — Other Ambulatory Visit: Payer: Self-pay | Admitting: Physician Assistant

## 2016-05-24 MED ORDER — CLONAZEPAM 1 MG PO TABS: 1.0000 mg | ORAL_TABLET | Freq: Two times a day (BID) | ORAL | 0 refills | Status: DC | PRN

## 2016-05-28 ENCOUNTER — Other Ambulatory Visit: Payer: Self-pay | Admitting: Family Medicine

## 2016-05-28 NOTE — Telephone Encounter (Signed)
Aware [facility] called [facility]

## 2016-06-25 ENCOUNTER — Other Ambulatory Visit: Payer: Self-pay | Admitting: Family Medicine

## 2016-06-25 NOTE — Telephone Encounter (Signed)
Last filled 05/24/16- last seen [facility] 02/22/16.  Michael Price is coverage

## 2016-06-25 NOTE — Telephone Encounter (Signed)
Patient aware that he will need to be seen for any further refills

## 2016-06-25 NOTE — Telephone Encounter (Signed)
Rx called in to pharmacy. 

## 2016-06-27 ENCOUNTER — Ambulatory Visit: Payer: BLUE CROSS/BLUE SHIELD | Admitting: Family Medicine

## 2016-06-27 NOTE — Progress Notes (Deleted)
   Subjective:    Patient ID: Michael Price, male    DOB: 12/27/1975, 40 y.o.   MRN: [id number]  HPI    Review of Systems     Objective:   Physical Exam        Assessment & Plan:

## 2016-06-28 ENCOUNTER — Encounter: Payer: Self-pay | Admitting: Family Medicine

## 2016-07-05 ENCOUNTER — Ambulatory Visit (INDEPENDENT_AMBULATORY_CARE_PROVIDER_SITE_OTHER): Payer: BLUE CROSS/BLUE SHIELD | Admitting: Family Medicine

## 2016-07-05 ENCOUNTER — Encounter: Payer: Self-pay | Admitting: Family Medicine

## 2016-07-05 DIAGNOSIS — Z0289 Encounter for other administrative examinations: Secondary | ICD-10-CM

## 2016-07-05 DIAGNOSIS — F411 Generalized anxiety disorder: Secondary | ICD-10-CM | POA: Insufficient documentation

## 2016-07-05 MED ORDER — CLONAZEPAM 1 MG PO TABS: ORAL_TABLET | ORAL | 2 refills | Status: DC

## 2016-07-05 NOTE — Progress Notes (Unsigned)
   Subjective:    Patient ID: Michael Price, male    DOB: 11/20/1975, 40 y.o.   MRN: [id number]  HPI    Review of Systems     Objective:   Physical Exam        Assessment & Plan:   

## 2016-07-05 NOTE — Progress Notes (Signed)
   Subjective:    Patient ID: Michael Price, male    DOB: 1976/07/10, 40 y.o.   MRN: [id number]  HPI 40 year old gentleman here to refill Klonopin. Last visit was 5 months ago. He has had some family issues. By history his mother has pancreatic cancer and father's just diagnosed with lung cancer. He has gained weight due to stress. Klonopin helps. He had also tried Celexa and Wellbutrin but both were associated with side effects.  Patient Active Problem List   Diagnosis Date Noted  . Headache 10/13/2015   Outpatient Encounter Prescriptions as of 07/05/2016  Medication Sig  . clonazePAM (KLONOPIN) 1 MG tablet TAKE 1 TABLET TWICE DAILY AS NEEDED FOR ANXIETY  . [DISCONTINUED] citalopram (CELEXA) 20 MG tablet Take 20 mg by mouth daily.  . [DISCONTINUED] citalopram (CELEXA) 40 MG tablet Take 40 mg by mouth daily.  . [DISCONTINUED] hydrOXYzine (ATARAX/VISTARIL) 25 MG tablet Take 1 tablet (25 mg total) by mouth every 8 (eight) hours as needed.  . [DISCONTINUED] naproxen (NAPROSYN) 500 MG tablet Take 1 tablet (500 mg total) by mouth 2 (two) times daily with a meal.  . [DISCONTINUED] venlafaxine XR (EFFEXOR XR) 150 MG 24 hr capsule Take 1 capsule (150 mg total) by mouth daily with breakfast.   No facility-administered encounter medications on file as of 07/05/2016.        Review of Systems  Respiratory: Negative.   Cardiovascular: Negative.   Psychiatric/Behavioral: The patient is nervous/anxious.        Objective:   Physical Exam  Constitutional: He is oriented to person, place, and time. He appears well-developed and well-nourished.  Cardiovascular: Normal rate and regular rhythm.   Pulmonary/Chest: Effort normal and breath sounds normal.  Neurological: He is alert and oriented to person, place, and time.  Psychiatric: He has a normal mood and affect. His behavior is normal.   BP 126/89   Pulse 97   Temp 98 F (36.7 C) (Oral)   Ht 5\' 11"  (1.803 m)   Wt 258 lb (117 kg)   BMI 35.98  kg/m         Assessment & Plan:  1. Generalized anxiety disorder Refill Klonopin 1 mg twice a day for one month with 2 refills. Sign drug contract discussed that with him.  Frederica KusterStephen M Miller MD

## 2016-07-06 ENCOUNTER — Ambulatory Visit: Payer: BLUE CROSS/BLUE SHIELD | Admitting: Family Medicine

## 2016-07-27 ENCOUNTER — Other Ambulatory Visit: Payer: Self-pay | Admitting: Family Medicine

## 2016-07-27 NOTE — Telephone Encounter (Signed)
Spoke with pt regarding RX Pharmacy has refills but too early to fill

## 2016-07-27 NOTE — Telephone Encounter (Signed)
Patient can not get a refill until December 9th.  Script has two refills but can only  get refills when a full 30 days has passed.

## 2016-08-06 ENCOUNTER — Encounter: Payer: Self-pay | Admitting: Family Medicine

## 2016-12-17 ENCOUNTER — Ambulatory Visit: Payer: Self-pay | Admitting: Orthopedic Surgery

## 2016-12-24 ENCOUNTER — Encounter: Payer: Self-pay | Admitting: Orthopedic Surgery

## 2016-12-24 ENCOUNTER — Ambulatory Visit: Payer: BLUE CROSS/BLUE SHIELD | Admitting: Orthopedic Surgery

## 2017-03-04 ENCOUNTER — Other Ambulatory Visit: Payer: Self-pay | Admitting: Physician Assistant

## 2017-10-14 ENCOUNTER — Other Ambulatory Visit: Payer: Self-pay | Admitting: Physician Assistant

## 2017-12-23 ENCOUNTER — Ambulatory Visit (INDEPENDENT_AMBULATORY_CARE_PROVIDER_SITE_OTHER): Payer: Self-pay | Admitting: Otolaryngology

## 2018-02-11 ENCOUNTER — Telehealth: Payer: Self-pay | Admitting: Orthopedic Surgery

## 2018-02-11 NOTE — Telephone Encounter (Signed)
Patient called, relayed that he was treated for a medical issue that he has received previous treatment for, including Xrays, and that it was initially filed as workers comp. States since then workers comp has denied; therefore, said his [place], [place] advised that he can go to any doctor of his choice.  Both Betty and I have advised patient of the 2nd opinion protocol, although he did not voice complete understanding.  He then had to quickly hang up due to receiving another call - said it may be the hospital, as he was just discharged for another medical reason.  States he will call back.

## 2018-02-17 NOTE — Telephone Encounter (Signed)
No further response or request as of 02/17/18.

## 2018-05-05 ENCOUNTER — Ambulatory Visit: Payer: BLUE CROSS/BLUE SHIELD | Admitting: Orthopedic Surgery

## 2018-05-05 ENCOUNTER — Encounter: Payer: Self-pay | Admitting: Orthopedic Surgery

## 2018-10-20 ENCOUNTER — Telehealth: Payer: Self-pay | Admitting: Orthopedic Surgery

## 2018-10-20 NOTE — Telephone Encounter (Signed)
Patient called to inquire about process of requesting medical records.  Discussed. Patient will come by in the next couple of days to sign release for [facility] provider representative to process request.

## 2020-03-17 ENCOUNTER — Encounter (HOSPITAL_COMMUNITY): Payer: Self-pay

## 2020-03-17 ENCOUNTER — Emergency Department (HOSPITAL_COMMUNITY): Payer: No Typology Code available for payment source | Admitting: Certified Registered Nurse Anesthetist

## 2020-03-17 ENCOUNTER — Inpatient Hospital Stay (HOSPITAL_COMMUNITY): Payer: No Typology Code available for payment source

## 2020-03-17 ENCOUNTER — Encounter (HOSPITAL_COMMUNITY): Admission: EM | Disposition: A | Discharge status: Home / Self Care | Payer: Self-pay | Source: Home / Self Care

## 2020-03-17 ENCOUNTER — Inpatient Hospital Stay (HOSPITAL_COMMUNITY)
Admission: EM | Admit: 2020-03-17 | Discharge: 2020-03-24 | DRG: 957 | Disposition: A | Discharge status: Home / Self Care | Payer: No Typology Code available for payment source | Attending: General Surgery | Admitting: General Surgery

## 2020-03-17 ENCOUNTER — Emergency Department (HOSPITAL_COMMUNITY): Payer: No Typology Code available for payment source

## 2020-03-17 DIAGNOSIS — T783XXA Angioneurotic edema, initial encounter: Secondary | ICD-10-CM | POA: Diagnosis not present

## 2020-03-17 DIAGNOSIS — Z419 Encounter for procedure for purposes other than remedying health state, unspecified: Secondary | ICD-10-CM

## 2020-03-17 DIAGNOSIS — S73004A Unspecified dislocation of right hip, initial encounter: Secondary | ICD-10-CM | POA: Diagnosis present

## 2020-03-17 DIAGNOSIS — S271XXA Traumatic hemothorax, initial encounter: Secondary | ICD-10-CM | POA: Diagnosis present

## 2020-03-17 DIAGNOSIS — I998 Other disorder of circulatory system: Secondary | ICD-10-CM

## 2020-03-17 DIAGNOSIS — S42302B Unspecified fracture of shaft of humerus, left arm, initial encounter for open fracture: Principal | ICD-10-CM | POA: Diagnosis present

## 2020-03-17 DIAGNOSIS — Y92410 Unspecified street and highway as the place of occurrence of the external cause: Secondary | ICD-10-CM | POA: Diagnosis not present

## 2020-03-17 DIAGNOSIS — S45112A Laceration of brachial artery, left side, initial encounter: Secondary | ICD-10-CM | POA: Diagnosis present

## 2020-03-17 DIAGNOSIS — E559 Vitamin D deficiency, unspecified: Secondary | ICD-10-CM | POA: Diagnosis present

## 2020-03-17 DIAGNOSIS — T1490XA Injury, unspecified, initial encounter: Secondary | ICD-10-CM | POA: Diagnosis present

## 2020-03-17 DIAGNOSIS — J9601 Acute respiratory failure with hypoxia: Secondary | ICD-10-CM | POA: Diagnosis not present

## 2020-03-17 DIAGNOSIS — S72051A Unspecified fracture of head of right femur, initial encounter for closed fracture: Secondary | ICD-10-CM | POA: Diagnosis present

## 2020-03-17 DIAGNOSIS — T148XXA Other injury of unspecified body region, initial encounter: Secondary | ICD-10-CM

## 2020-03-17 DIAGNOSIS — Z978 Presence of other specified devices: Secondary | ICD-10-CM

## 2020-03-17 DIAGNOSIS — F419 Anxiety disorder, unspecified: Secondary | ICD-10-CM | POA: Diagnosis present

## 2020-03-17 DIAGNOSIS — R52 Pain, unspecified: Secondary | ICD-10-CM

## 2020-03-17 DIAGNOSIS — E876 Hypokalemia: Secondary | ICD-10-CM | POA: Diagnosis present

## 2020-03-17 DIAGNOSIS — S42325B Nondisplaced transverse fracture of shaft of humerus, left arm, initial encounter for open fracture: Secondary | ICD-10-CM

## 2020-03-17 DIAGNOSIS — D62 Acute posthemorrhagic anemia: Secondary | ICD-10-CM | POA: Diagnosis present

## 2020-03-17 DIAGNOSIS — R202 Paresthesia of skin: Secondary | ICD-10-CM | POA: Diagnosis not present

## 2020-03-17 DIAGNOSIS — S45109A Unspecified injury of brachial artery, unspecified side, initial encounter: Secondary | ICD-10-CM | POA: Diagnosis present

## 2020-03-17 DIAGNOSIS — K219 Gastro-esophageal reflux disease without esophagitis: Secondary | ICD-10-CM | POA: Diagnosis present

## 2020-03-17 DIAGNOSIS — Z20822 Contact with and (suspected) exposure to covid-19: Secondary | ICD-10-CM | POA: Diagnosis present

## 2020-03-17 DIAGNOSIS — S45102A Unspecified injury of brachial artery, left side, initial encounter: Secondary | ICD-10-CM

## 2020-03-17 HISTORY — PX: HIP CLOSED REDUCTION: SHX983

## 2020-03-17 HISTORY — PX: I & D EXTREMITY: SHX5045

## 2020-03-17 HISTORY — PX: ARTERY EXPLORATION: SHX5110

## 2020-03-17 HISTORY — DX: Unspecified mastoiditis, unspecified ear: H70.90

## 2020-03-17 LAB — SARS CORONAVIRUS 2 BY RT PCR (HOSPITAL ORDER, PERFORMED IN ~~LOC~~ HOSPITAL LAB): SARS Coronavirus 2: NEGATIVE

## 2020-03-17 LAB — POCT I-STAT 7, (LYTES, BLD GAS, ICA,H+H)
Acid-base deficit: 2 mmol/L (ref 0.0–2.0)
Acid-base deficit: 2 mmol/L (ref 0.0–2.0)
Acid-base deficit: 3 mmol/L — ABNORMAL HIGH (ref 0.0–2.0)
Acid-base deficit: 4 mmol/L — ABNORMAL HIGH (ref 0.0–2.0)
Acid-base deficit: 1 mmol/L (ref 0.0–2.0)
Bicarbonate: 24.4 mmol/L (ref 20.0–28.0)
Bicarbonate: 25.2 mmol/L (ref 20.0–28.0)
Bicarbonate: 22.3 mmol/L (ref 20.0–28.0)
Bicarbonate: 23.2 mmol/L (ref 20.0–28.0)
Bicarbonate: 24.7 mmol/L (ref 20.0–28.0)
Calcium, Ion: 1.16 mmol/L (ref 1.15–1.40)
Calcium, Ion: 1.2 mmol/L (ref 1.15–1.40)
Calcium, Ion: 0.92 mmol/L — ABNORMAL LOW (ref 1.15–1.40)
Calcium, Ion: 1.02 mmol/L — ABNORMAL LOW (ref 1.15–1.40)
Calcium, Ion: 1.03 mmol/L — ABNORMAL LOW (ref 1.15–1.40)
HCT: 21 % — ABNORMAL LOW (ref 39.0–52.0)
HCT: 24 % — ABNORMAL LOW (ref 39.0–52.0)
HCT: 25 % — ABNORMAL LOW (ref 39.0–52.0)
HCT: 27 % — ABNORMAL LOW (ref 39.0–52.0)
HCT: 30 % — ABNORMAL LOW (ref 39.0–52.0)
Hemoglobin: 7.1 g/dL — ABNORMAL LOW (ref 13.0–17.0)
Hemoglobin: 8.2 g/dL — ABNORMAL LOW (ref 13.0–17.0)
Hemoglobin: 8.5 g/dL — ABNORMAL LOW (ref 13.0–17.0)
Hemoglobin: 9.2 g/dL — ABNORMAL LOW (ref 13.0–17.0)
Hemoglobin: 10.2 g/dL — ABNORMAL LOW (ref 13.0–17.0)
O2 Saturation: 100 %
O2 Saturation: 100 %
O2 Saturation: 100 %
O2 Saturation: 97 %
O2 Saturation: 100 %
Patient temperature: 35.4
Patient temperature: 35.5
Patient temperature: 35.4
Patient temperature: 34.9
Potassium: 3.5 mmol/L (ref 3.5–5.1)
Potassium: 4.1 mmol/L (ref 3.5–5.1)
Potassium: 4.1 mmol/L (ref 3.5–5.1)
Potassium: 4.2 mmol/L (ref 3.5–5.1)
Potassium: 3.7 mmol/L (ref 3.5–5.1)
Sodium: 143 mmol/L (ref 135–145)
Sodium: 143 mmol/L (ref 135–145)
Sodium: 143 mmol/L (ref 135–145)
Sodium: 143 mmol/L (ref 135–145)
Sodium: 143 mmol/L (ref 135–145)
TCO2: 26 mmol/L (ref 22–32)
TCO2: 27 mmol/L (ref 22–32)
TCO2: 24 mmol/L (ref 22–32)
TCO2: 25 mmol/L (ref 22–32)
TCO2: 26 mmol/L (ref 22–32)
pCO2 arterial: 44.3 mmHg (ref 32.0–48.0)
pCO2 arterial: 50 mmHg — ABNORMAL HIGH (ref 32.0–48.0)
pCO2 arterial: 41.6 mmHg (ref 32.0–48.0)
pCO2 arterial: 46.2 mmHg (ref 32.0–48.0)
pCO2 arterial: 42.7 mmHg (ref 32.0–48.0)
pH, Arterial: 7.302 — ABNORMAL LOW (ref 7.350–7.450)
pH, Arterial: 7.342 — ABNORMAL LOW (ref 7.350–7.450)
pH, Arterial: 7.309 — ABNORMAL LOW (ref 7.350–7.450)
pH, Arterial: 7.329 — ABNORMAL LOW (ref 7.350–7.450)
pH, Arterial: 7.361 (ref 7.350–7.450)
pO2, Arterial: 298 mmHg — ABNORMAL HIGH (ref 83.0–108.0)
pO2, Arterial: 391 mmHg — ABNORMAL HIGH (ref 83.0–108.0)
pO2, Arterial: 101 mmHg (ref 83.0–108.0)
pO2, Arterial: 258 mmHg — ABNORMAL HIGH (ref 83.0–108.0)
pO2, Arterial: 232 mmHg — ABNORMAL HIGH (ref 83.0–108.0)

## 2020-03-17 LAB — COMPREHENSIVE METABOLIC PANEL
ALT: 29 U/L (ref 0–44)
AST: 30 U/L (ref 15–41)
Albumin: 2.4 g/dL — ABNORMAL LOW (ref 3.5–5.0)
Alkaline Phosphatase: 42 U/L (ref 38–126)
Anion gap: 6 (ref 5–15)
BUN: 11 mg/dL (ref 6–20)
CO2: 23 mmol/L (ref 22–32)
Calcium: 6.6 mg/dL — ABNORMAL LOW (ref 8.9–10.3)
Chloride: 113 mmol/L — ABNORMAL HIGH (ref 98–111)
Creatinine, Ser: 0.86 mg/dL (ref 0.61–1.24)
GFR calc Af Amer: >60 mL/min (ref >60)
GFR calc non Af Amer: >60 mL/min (ref >60)
Glucose, Bld: 132 mg/dL — ABNORMAL HIGH (ref 70–99)
Potassium: 2.8 mmol/L — ABNORMAL LOW (ref 3.5–5.1)
Sodium: 142 mmol/L (ref 135–145)
Total Bilirubin: 0.3 mg/dL (ref 0.3–1.2)
Total Protein: 4.3 g/dL — ABNORMAL LOW (ref 6.5–8.1)

## 2020-03-17 LAB — CBC
HCT: 34.8 % — ABNORMAL LOW (ref 39.0–52.0)
Hemoglobin: 10.6 g/dL — ABNORMAL LOW (ref 13.0–17.0)
MCH: 29.3 pg (ref 26.0–34.0)
MCHC: 30.5 g/dL (ref 30.0–36.0)
MCV: 96.1 fL (ref 80.0–100.0)
Platelets: 230 10*3/uL (ref 150–400)
RBC: 3.62 MIL/uL — ABNORMAL LOW (ref 4.22–5.81)
RDW: 12.2 % (ref 11.5–15.5)
WBC: 6.9 10*3/uL (ref 4.0–10.5)
nRBC: 0 % (ref 0.0–0.2)

## 2020-03-17 LAB — PROTIME-INR
INR: 1.2 (ref 0.8–1.2)
Prothrombin Time: 14.4 seconds (ref 11.4–15.2)

## 2020-03-17 LAB — I-STAT CHEM 8, ED
BUN: 12 mg/dL (ref 6–20)
Calcium, Ion: 1.06 mmol/L — ABNORMAL LOW (ref 1.15–1.40)
Chloride: 108 mmol/L (ref 98–111)
Creatinine, Ser: 0.8 mg/dL (ref 0.61–1.24)
Glucose, Bld: 127 mg/dL — ABNORMAL HIGH (ref 70–99)
HCT: 30 % — ABNORMAL LOW (ref 39.0–52.0)
Hemoglobin: 10.2 g/dL — ABNORMAL LOW (ref 13.0–17.0)
Potassium: 3 mmol/L — ABNORMAL LOW (ref 3.5–5.1)
Sodium: 146 mmol/L — ABNORMAL HIGH (ref 135–145)
TCO2: 24 mmol/L (ref 22–32)

## 2020-03-17 LAB — ETHANOL: Alcohol, Ethyl (B): <10 mg/dL (ref <10)

## 2020-03-17 LAB — SAMPLE TO BLOOD BANK

## 2020-03-17 LAB — TRAUMA TEG PANEL
CFF Max Amplitude: 18.3 mm (ref 15–32)
Citrated Kaolin (R): 4.6 min (ref 4.6–9.1)
Citrated Rapid TEG (MA): 58.5 mm (ref 52–70)
Lysis at 30 Minutes: 0.6 % (ref 0.0–2.6)

## 2020-03-17 LAB — LACTIC ACID, PLASMA: Lactic Acid, Venous: 1.5 mmol/L (ref 0.5–1.9)

## 2020-03-17 LAB — ABO/RH: ABO/RH(D): O POS

## 2020-03-17 SURGERY — EXPLORATION, ARTERY
Anesthesia: General | Site: Hip | Laterality: Right

## 2020-03-17 MED ORDER — LIDOCAINE 2% (20 MG/ML) 5 ML SYRINGE
INTRAMUSCULAR | Status: AC
  Filled 2020-03-17: qty 10

## 2020-03-17 MED ORDER — THROMBIN (RECOMBINANT) 20000 UNITS EX SOLR
CUTANEOUS | Status: AC
  Filled 2020-03-17: qty 20000

## 2020-03-17 MED ORDER — FENTANYL CITRATE (PF) 100 MCG/2ML IJ SOLN
INTRAMUSCULAR | Status: AC
  Filled 2020-03-17: qty 2

## 2020-03-17 MED ORDER — ROCURONIUM BROMIDE 10 MG/ML (PF) SYRINGE
PREFILLED_SYRINGE | INTRAVENOUS | Status: AC
  Filled 2020-03-17: qty 30

## 2020-03-17 MED ORDER — FENTANYL CITRATE (PF) 250 MCG/5ML IJ SOLN
INTRAMUSCULAR | Status: AC
  Filled 2020-03-17: qty 5

## 2020-03-17 MED ORDER — PHENYLEPHRINE HCL (PRESSORS) 10 MG/ML IV SOLN
INTRAVENOUS | Status: DC | PRN
  Administered 2020-03-17: 120 ug via INTRAVENOUS

## 2020-03-17 MED ORDER — SUCCINYLCHOLINE CHLORIDE 20 MG/ML IJ SOLN
INTRAMUSCULAR | Status: DC | PRN
  Administered 2020-03-17: 120 mg via INTRAVENOUS

## 2020-03-17 MED ORDER — VASOPRESSIN 20 UNIT/ML IV SOLN
INTRAVENOUS | Status: AC
  Filled 2020-03-17: qty 1

## 2020-03-17 MED ORDER — SODIUM CHLORIDE 0.9 % IV SOLN
INTRAVENOUS | Status: AC
  Filled 2020-03-17: qty 1.2

## 2020-03-17 MED ORDER — FENTANYL CITRATE (PF) 100 MCG/2ML IJ SOLN: 50.0000 ug | Freq: Once | INTRAMUSCULAR | Status: DC

## 2020-03-17 MED ORDER — ROCURONIUM 10MG/ML (10ML) SYRINGE FOR MEDFUSION PUMP - OPTIME
INTRAVENOUS | Status: DC | PRN
  Administered 2020-03-17: 50 mg via INTRAVENOUS
  Administered 2020-03-17: 100 mg via INTRAVENOUS

## 2020-03-17 MED ORDER — SODIUM CHLORIDE 0.9 % IR SOLN
Status: DC | PRN
  Administered 2020-03-17: 1000 mL

## 2020-03-17 MED ORDER — DOCUSATE SODIUM 100 MG PO CAPS
100.0000 mg | ORAL_CAPSULE | Freq: Two times a day (BID) | ORAL | Status: DC
  Administered 2020-03-18 – 2020-03-24 (×12): 100 mg via ORAL
  Filled 2020-03-17 (×12): qty 1

## 2020-03-17 MED ORDER — PROPOFOL 10 MG/ML IV BOLUS
INTRAVENOUS | Status: DC | PRN
  Administered 2020-03-17: 170 mg via INTRAVENOUS

## 2020-03-17 MED ORDER — PHENYLEPHRINE 40 MCG/ML (10ML) SYRINGE FOR IV PUSH (FOR BLOOD PRESSURE SUPPORT)
PREFILLED_SYRINGE | INTRAVENOUS | Status: AC
  Filled 2020-03-17: qty 10

## 2020-03-17 MED ORDER — LIDOCAINE-EPINEPHRINE 1 %-1:100000 IJ SOLN
INTRAMUSCULAR | Status: AC
  Filled 2020-03-17: qty 1

## 2020-03-17 MED ORDER — ACETAMINOPHEN 325 MG PO TABS
650.0000 mg | ORAL_TABLET | ORAL | Status: DC | PRN
  Administered 2020-03-20 (×2): 650 mg via ORAL
  Filled 2020-03-17 (×2): qty 2

## 2020-03-17 MED ORDER — DEXTROSE-NACL 5-0.45 % IV SOLN: INTRAVENOUS | Status: DC

## 2020-03-17 MED ORDER — SODIUM CHLORIDE 0.9 % IV SOLN
INTRAVENOUS | Status: DC | PRN
  Administered 2020-03-17: 500 mL

## 2020-03-17 MED ORDER — CEFAZOLIN SODIUM-DEXTROSE 2-4 GM/100ML-% IV SOLN: 2.0000 g | Freq: Once | INTRAVENOUS | Status: DC

## 2020-03-17 MED ORDER — PROPOFOL 10 MG/ML IV BOLUS
INTRAVENOUS | Status: AC
  Filled 2020-03-17: qty 20

## 2020-03-17 MED ORDER — CEFAZOLIN SODIUM-DEXTROSE 2-3 GM-%(50ML) IV SOLR
INTRAVENOUS | Status: DC | PRN
  Administered 2020-03-17: 2 g via INTRAVENOUS

## 2020-03-17 MED ORDER — LACTATED RINGERS IV SOLN: INTRAVENOUS | Status: DC | PRN

## 2020-03-17 MED ORDER — TETANUS-DIPHTH-ACELL PERTUSSIS 5-2.5-18.5 LF-MCG/0.5 IM SUSP: 0.5000 mL | Freq: Once | INTRAMUSCULAR | Status: DC

## 2020-03-17 MED ORDER — VASOPRESSIN 20 UNIT/ML IV SOLN
INTRAVENOUS | Status: DC | PRN
  Administered 2020-03-17: 1 [IU] via INTRAVENOUS
  Administered 2020-03-17: 2 [IU] via INTRAVENOUS
  Administered 2020-03-17: 3 [IU] via INTRAVENOUS

## 2020-03-17 MED ORDER — DIPHENHYDRAMINE HCL 50 MG/ML IJ SOLN
INTRAMUSCULAR | Status: DC | PRN
  Administered 2020-03-17: 50 mg via INTRAVENOUS

## 2020-03-17 MED ORDER — SODIUM CHLORIDE 0.9 % IV SOLN: INTRAVENOUS | Status: DC | PRN

## 2020-03-17 MED ORDER — VASOPRESSIN 20 UNIT/ML IV SOLN
INTRAVENOUS | Status: AC
  Filled 2020-03-17: qty 2

## 2020-03-17 MED ORDER — IOHEXOL 300 MG/ML  SOLN
100.0000 mL | Freq: Once | INTRAMUSCULAR | Status: AC | PRN
  Administered 2020-03-17: 100 mL via INTRAVENOUS

## 2020-03-17 MED ORDER — EPHEDRINE 5 MG/ML INJ
INTRAVENOUS | Status: AC
  Filled 2020-03-17: qty 10

## 2020-03-17 MED ORDER — SODIUM CHLORIDE (PF) 0.9 % IJ SOLN
INTRAMUSCULAR | Status: AC
  Filled 2020-03-17: qty 10

## 2020-03-17 MED ORDER — OXYCODONE HCL 5 MG PO TABS: 5.0000 mg | ORAL_TABLET | ORAL | Status: DC | PRN

## 2020-03-17 MED ORDER — LIDOCAINE HCL (CARDIAC) PF 100 MG/5ML IV SOSY
PREFILLED_SYRINGE | INTRAVENOUS | Status: DC | PRN
  Administered 2020-03-17: 60 mg via INTRATRACHEAL

## 2020-03-17 MED ORDER — LIDOCAINE HCL (PF) 1 % IJ SOLN
INTRAMUSCULAR | Status: AC
  Filled 2020-03-17: qty 30

## 2020-03-17 MED ORDER — PAPAVERINE HCL 30 MG/ML IJ SOLN
INTRAMUSCULAR | Status: AC
  Filled 2020-03-17: qty 2

## 2020-03-17 MED ORDER — DEXAMETHASONE SODIUM PHOSPHATE 10 MG/ML IJ SOLN
INTRAMUSCULAR | Status: DC | PRN
  Administered 2020-03-17: 10 mg via INTRAVENOUS

## 2020-03-17 MED ORDER — CALCIUM CHLORIDE 10 % IV SOLN
INTRAVENOUS | Status: DC | PRN
  Administered 2020-03-17: 200 mg via INTRAVENOUS

## 2020-03-17 MED ORDER — FENTANYL CITRATE (PF) 250 MCG/5ML IJ SOLN
INTRAMUSCULAR | Status: DC | PRN
  Administered 2020-03-17: 50 ug via INTRAVENOUS
  Administered 2020-03-17 (×4): 100 ug via INTRAVENOUS
  Administered 2020-03-17: 50 ug via INTRAVENOUS

## 2020-03-17 MED ORDER — ONDANSETRON HCL 4 MG/2ML IJ SOLN
4.0000 mg | Freq: Four times a day (QID) | INTRAMUSCULAR | Status: DC | PRN
  Administered 2020-03-18: 4 mg via INTRAVENOUS
  Filled 2020-03-17: qty 2

## 2020-03-17 MED ORDER — ARTIFICIAL TEARS OPHTHALMIC OINT
TOPICAL_OINTMENT | OPHTHALMIC | Status: AC
  Filled 2020-03-17: qty 3.5

## 2020-03-17 MED ORDER — MORPHINE SULFATE (PF) 2 MG/ML IV SOLN
2.0000 mg | INTRAVENOUS | Status: DC | PRN
  Administered 2020-03-18: 2 mg via INTRAVENOUS
  Administered 2020-03-18: 4 mg via INTRAVENOUS
  Administered 2020-03-18: 2 mg via INTRAVENOUS
  Administered 2020-03-19 (×4): 4 mg via INTRAVENOUS
  Administered 2020-03-20 (×2): 2 mg via INTRAVENOUS
  Administered 2020-03-20: 4 mg via INTRAVENOUS
  Administered 2020-03-21 (×2): 2 mg via INTRAVENOUS
  Filled 2020-03-17 (×3): qty 2
  Filled 2020-03-17 (×2): qty 1
  Filled 2020-03-17: qty 2
  Filled 2020-03-17 (×3): qty 1
  Filled 2020-03-17 (×2): qty 2
  Filled 2020-03-17: qty 1

## 2020-03-17 MED ORDER — EPINEPHRINE PF 1 MG/ML IJ SOLN
INTRAVENOUS | Status: DC | PRN
  Administered 2020-03-17: 3 ug/min via INTRAVENOUS

## 2020-03-17 MED ORDER — DIPHENHYDRAMINE HCL 50 MG/ML IJ SOLN
INTRAMUSCULAR | Status: AC
  Filled 2020-03-17: qty 1

## 2020-03-17 MED ORDER — DEXAMETHASONE SODIUM PHOSPHATE 10 MG/ML IJ SOLN
INTRAMUSCULAR | Status: AC
  Filled 2020-03-17: qty 1

## 2020-03-17 MED ORDER — EPHEDRINE SULFATE 50 MG/ML IJ SOLN
INTRAMUSCULAR | Status: DC | PRN
  Administered 2020-03-17: 10 mg via INTRAVENOUS

## 2020-03-17 MED ORDER — CEFAZOLIN SODIUM 1 G IJ SOLR
INTRAMUSCULAR | Status: AC
  Filled 2020-03-17: qty 20

## 2020-03-17 MED ORDER — METHYLPREDNISOLONE SODIUM SUCC 125 MG IJ SOLR
125.0000 mg | INTRAMUSCULAR | Status: AC
  Administered 2020-03-17: 125 mg via INTRAVENOUS
  Filled 2020-03-17: qty 2

## 2020-03-17 MED ORDER — ENOXAPARIN SODIUM 40 MG/0.4ML ~~LOC~~ SOLN
40.0000 mg | SUBCUTANEOUS | Status: DC
  Administered 2020-03-18: 40 mg via SUBCUTANEOUS
  Filled 2020-03-17: qty 0.4

## 2020-03-17 MED ORDER — SUCCINYLCHOLINE CHLORIDE 200 MG/10ML IV SOSY
PREFILLED_SYRINGE | INTRAVENOUS | Status: AC
  Filled 2020-03-17: qty 10

## 2020-03-17 MED ORDER — PROPOFOL 1000 MG/100ML IV EMUL
INTRAVENOUS | Status: AC
  Filled 2020-03-17: qty 100

## 2020-03-17 MED ORDER — LIDOCAINE-EPINEPHRINE (PF) 1 %-1:200000 IJ SOLN
INTRAMUSCULAR | Status: AC
  Filled 2020-03-17: qty 30

## 2020-03-17 MED ORDER — ONDANSETRON 4 MG PO TBDP
4.0000 mg | ORAL_TABLET | Freq: Four times a day (QID) | ORAL | Status: DC | PRN
  Administered 2020-03-21: 4 mg via ORAL
  Filled 2020-03-17: qty 1

## 2020-03-17 MED ORDER — PAPAVERINE HCL 30 MG/ML IJ SOLN
INTRAMUSCULAR | Status: DC | PRN
  Administered 2020-03-17: 60 mg via INTRAVENOUS

## 2020-03-17 SURGICAL SUPPLY — 82 items
ADH SKN CLS APL DERMABOND .7 (GAUZE/BANDAGES/DRESSINGS) ×6
BLADE SURG 10 STRL SS (BLADE) ×2 IMPLANT
BLADE SURG 15 STRL LF DISP TIS (BLADE) IMPLANT
BLADE SURG 15 STRL SS (BLADE) ×5
BNDG CMPR 9X4 STRL LF SNTH (GAUZE/BANDAGES/DRESSINGS) ×3
BNDG CMPR MED 10X6 ELC LF (GAUZE/BANDAGES/DRESSINGS) ×3
BNDG ELASTIC 6X10 VLCR STRL LF (GAUZE/BANDAGES/DRESSINGS) ×2 IMPLANT
BNDG ESMARK 4X9 LF (GAUZE/BANDAGES/DRESSINGS) ×2 IMPLANT
BNDG GAUZE ELAST 4 BULKY (GAUZE/BANDAGES/DRESSINGS) ×2 IMPLANT
CANISTER SUCT 3000ML PPV (MISCELLANEOUS) ×5 IMPLANT
CANNULA VESSEL 3MM BLUNT TIP (CANNULA) ×4 IMPLANT
CLIP VESOCCLUDE MED 24/CT (CLIP) ×2 IMPLANT
CLIP VESOCCLUDE MED 6/CT (CLIP) ×5 IMPLANT
CLIP VESOCCLUDE SM WIDE 24/CT (CLIP) ×2 IMPLANT
CLIP VESOCCLUDE SM WIDE 6/CT (CLIP) ×5 IMPLANT
COVER SURGICAL LIGHT HANDLE (MISCELLANEOUS) ×2 IMPLANT
COVER WAND RF STERILE (DRAPES) ×3 IMPLANT
CUFF TOURN SGL QUICK 18X4 (TOURNIQUET CUFF) IMPLANT
CUFF TOURN SGL QUICK 24 (TOURNIQUET CUFF)
CUFF TRNQT CYL 24X4X16.5-23 (TOURNIQUET CUFF) IMPLANT
DECANTER SPIKE VIAL GLASS SM (MISCELLANEOUS) IMPLANT
DERMABOND ADVANCED (GAUZE/BANDAGES/DRESSINGS) ×4
DERMABOND ADVANCED .7 DNX12 (GAUZE/BANDAGES/DRESSINGS) ×3 IMPLANT
DRAPE IMP U-DRAPE 54X76 (DRAPES) ×4 IMPLANT
DRAPE ORTHO SPLIT 77X108 STRL (DRAPES) ×10
DRAPE SURG ORHT 6 SPLT 77X108 (DRAPES) IMPLANT
ELECT CAUTERY BLADE 6.4 (BLADE) ×2 IMPLANT
ELECT REM PT RETURN 9FT ADLT (ELECTROSURGICAL) ×10
ELECTRODE REM PT RTRN 9FT ADLT (ELECTROSURGICAL) ×3 IMPLANT
GAUZE XEROFORM 5X9 LF (GAUZE/BANDAGES/DRESSINGS) ×2 IMPLANT
GLOVE BIO SURGEON STRL SZ 6.5 (GLOVE) ×4 IMPLANT
GLOVE BIO SURGEON STRL SZ7.5 (GLOVE) ×5 IMPLANT
GLOVE BIO SURGEONS STRL SZ 6.5 (GLOVE) ×4
GLOVE BIOGEL PI IND STRL 6.5 (GLOVE) IMPLANT
GLOVE BIOGEL PI IND STRL 7.0 (GLOVE) IMPLANT
GLOVE BIOGEL PI IND STRL 7.5 (GLOVE) IMPLANT
GLOVE BIOGEL PI IND STRL 8 (GLOVE) ×3 IMPLANT
GLOVE BIOGEL PI INDICATOR 6.5 (GLOVE) ×2
GLOVE BIOGEL PI INDICATOR 7.0 (GLOVE) ×4
GLOVE BIOGEL PI INDICATOR 7.5 (GLOVE) ×8
GLOVE BIOGEL PI INDICATOR 8 (GLOVE) ×6
GLOVE SKINSENSE NS SZ7.5 (GLOVE) ×4
GLOVE SKINSENSE STRL SZ7.5 (GLOVE) IMPLANT
GLOVE SURG SS PI 7.0 STRL IVOR (GLOVE) ×2 IMPLANT
GLOVE SURG SS PI 7.5 STRL IVOR (GLOVE) ×8 IMPLANT
GLOVE SURG SYN 7.5  E (GLOVE) ×25
GLOVE SURG SYN 7.5 E (GLOVE) ×15 IMPLANT
GLOVE SURG SYN 7.5 PF PI (GLOVE) IMPLANT
GOWN STRL REIN XL XLG (GOWN DISPOSABLE) ×4 IMPLANT
GOWN STRL REUS W/ TWL LRG LVL3 (GOWN DISPOSABLE) ×9 IMPLANT
GOWN STRL REUS W/TWL LRG LVL3 (GOWN DISPOSABLE) ×50
KIT BASIN OR (CUSTOM PROCEDURE TRAY) ×9 IMPLANT
KIT TURNOVER KIT B (KITS) ×5 IMPLANT
NS IRRIG 1000ML POUR BTL (IV SOLUTION) ×9 IMPLANT
PACK CV ACCESS (CUSTOM PROCEDURE TRAY) ×5 IMPLANT
PAD ABD 8X10 STRL (GAUZE/BANDAGES/DRESSINGS) ×4 IMPLANT
PAD ARMBOARD 7.5X6 YLW CONV (MISCELLANEOUS) ×10 IMPLANT
PAD CAST 4YDX4 CTTN HI CHSV (CAST SUPPLIES) IMPLANT
PADDING CAST COTTON 4X4 STRL (CAST SUPPLIES) ×10
PENCIL BUTTON HOLSTER BLD 10FT (ELECTRODE) ×2 IMPLANT
SET CYSTO W/LG BORE CLAMP LF (SET/KITS/TRAYS/PACK) ×2 IMPLANT
SLING ARM FOAM STRAP XLG (SOFTGOODS) ×2 IMPLANT
SPLINT FIBERGLASS 4X30 (CAST SUPPLIES) ×2 IMPLANT
SPONGE LAP 18X18 X RAY DECT (DISPOSABLE) ×8 IMPLANT
SPONGE SURGIFOAM ABS GEL 100 (HEMOSTASIS) IMPLANT
STOCKINETTE 6  STRL (DRAPES) ×5
STOCKINETTE 6 STRL (DRAPES) IMPLANT
SUT ETHILON 2 0 FS 18 (SUTURE) ×6 IMPLANT
SUT ETHILON 2 0 PSLX (SUTURE) ×10 IMPLANT
SUT PROLENE 5 0 C 1 24 (SUTURE) ×4 IMPLANT
SUT PROLENE 6 0 BV (SUTURE) ×25 IMPLANT
SUT VIC AB 3-0 SH 27 (SUTURE) ×10
SUT VIC AB 3-0 SH 27X BRD (SUTURE) ×3 IMPLANT
SUT VICRYL 4-0 PS2 18IN ABS (SUTURE) ×7 IMPLANT
SYR BULB IRRIG 60ML STRL (SYRINGE) ×2 IMPLANT
TAPE UMBILICAL COTTON 1/8X30 (MISCELLANEOUS) ×2 IMPLANT
TOWEL GREEN STERILE (TOWEL DISPOSABLE) ×5 IMPLANT
TUBE CONNECTING 20'X1/4 (TUBING) ×1
TUBE CONNECTING 20X1/4 (TUBING) ×1 IMPLANT
UNDERPAD 30X36 HEAVY ABSORB (UNDERPADS AND DIAPERS) ×5 IMPLANT
WATER STERILE IRR 1000ML POUR (IV SOLUTION) ×5 IMPLANT
YANKAUER SUCT BULB TIP NO VENT (SUCTIONS) ×2 IMPLANT
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
## 2020-03-17 NOTE — Anesthesia Procedure Notes (Signed)
Central Venous Catheter Insertion Performed by: Kipp Brood, MD, anesthesiologist Start/End7/22/2021 8:10 PM, 03/17/2020 8:15 PM Patient location: Pre-op. Preanesthetic checklist: patient identified, IV checked, site marked, risks and benefits discussed, surgical consent, monitors and equipment checked, pre-op evaluation, timeout performed and anesthesia consent Lidocaine 1% used for infiltration and patient sedated Hand hygiene performed  and maximum sterile barriers used  Catheter size: 9 Fr Sheath introducer Procedure performed without using ultrasound guided technique. Attempts: 1 Following insertion, line sutured, dressing applied and Biopatch. Post procedure assessment: blood return through all ports, free fluid flow and no air  Patient tolerated the procedure well with no immediate complications.
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
## 2020-03-17 NOTE — Anesthesia Procedure Notes (Signed)
Procedure Name: Intubation Date/Time: 03/17/2020 8:11 PM Performed by: Claudina Lick, CRNA Pre-anesthesia Checklist: Patient identified, Emergency Drugs available, Suction available, Patient being monitored and Timeout performed Patient Re-evaluated:Patient Re-evaluated prior to induction Oxygen Delivery Method: Circle system utilized Preoxygenation: Pre-oxygenation with 100% oxygen Induction Type: IV induction, Rapid sequence and Cricoid Pressure applied Laryngoscope Size: Glidescope and 4 Grade View: Grade I Tube size: 7.5 mm Airway Equipment and Method: Stylet and Video-laryngoscopy Placement Confirmation: ETT inserted through vocal cords under direct vision,  positive ETCO2 and breath sounds checked- equal and bilateral Secured at: 22 cm Tube secured with: Tape Dental Injury: Teeth and Oropharynx as per pre-operative assessment  Difficulty Due To: Difficult Airway- due to cervical collar

## 2020-03-17 NOTE — Anesthesia Procedure Notes (Deleted)
Procedure Name: Intubation Date/Time: 03/17/2020 8:51 PM Performed by: Claudina Lick, CRNA Pre-anesthesia Checklist: Patient identified, Emergency Drugs available, Suction available, Patient being monitored and Timeout performed Patient Re-evaluated:Patient Re-evaluated prior to induction Oxygen Delivery Method: Circle system utilized Preoxygenation: Pre-oxygenation with 100% oxygen

## 2020-03-17 NOTE — Anesthesia Preprocedure Evaluation (Signed)
Anesthesia Evaluation  Patient identified by MRN, date of birth, ID band Patient awake    Reviewed: Allergy & Precautions, NPO status , Patient's Chart, lab work & pertinent test results  Airway Mallampati: II  TM Distance: >3 FB Neck ROM: Limited    Dental  (+) Teeth Intact   Pulmonary    breath sounds clear to auscultation       Cardiovascular  Rhythm:Regular Rate:Tachycardia     Neuro/Psych    GI/Hepatic   Endo/Other    Renal/GU      Musculoskeletal   Abdominal   Peds  Hematology   Anesthesia Other Findings   Reproductive/Obstetrics                             Anesthesia Physical Anesthesia Plan  ASA: IV and emergent  Anesthesia Plan: General   Post-op Pain Management:    Induction: Intravenous and Cricoid pressure planned  PONV Risk Score and Plan: Ondansetron and Dexamethasone  Airway Management Planned: Oral ETT  Additional Equipment: Arterial line and CVP  Intra-op Plan:   Post-operative Plan: Possible Post-op intubation/ventilation  Informed Consent: I have reviewed the patients History and Physical, chart, labs and discussed the procedure including the risks, benefits and alternatives for the proposed anesthesia with the patient or authorized representative who has indicated his/her understanding and acceptance.       Plan Discussed with: Anesthesiologist and CRNA  Anesthesia Plan Comments:         Anesthesia Quick Evaluation
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
## 2020-03-17 NOTE — Anesthesia Procedure Notes (Signed)
Arterial Line Insertion Start/End7/22/2021 8:30 PM, 03/17/2020 8:58 PM Performed by: Adonis Housekeeper, CRNA, CRNA  Patient location: OR. Right, radial was placed Catheter size: 20 G Maximum sterile barriers used   Attempts: 1 Procedure performed without using ultrasound guided technique. Ultrasound Notes:anatomy identified and needle tip was noted to be adjacent to the nerve/plexus identified Following insertion, Biopatch and dressing applied. Patient tolerated the procedure well with no immediate complications.
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
## 2020-03-18 ENCOUNTER — Inpatient Hospital Stay (HOSPITAL_COMMUNITY): Payer: No Typology Code available for payment source

## 2020-03-18 ENCOUNTER — Encounter (HOSPITAL_COMMUNITY): Payer: Self-pay

## 2020-03-18 ENCOUNTER — Other Ambulatory Visit: Payer: Self-pay

## 2020-03-18 DIAGNOSIS — S42325B Nondisplaced transverse fracture of shaft of humerus, left arm, initial encounter for open fracture: Secondary | ICD-10-CM

## 2020-03-18 DIAGNOSIS — S73004A Unspecified dislocation of right hip, initial encounter: Secondary | ICD-10-CM

## 2020-03-18 DIAGNOSIS — S45102A Unspecified injury of brachial artery, left side, initial encounter: Secondary | ICD-10-CM

## 2020-03-18 LAB — BPAM FFP
Blood Product Expiration Date: 202107272359
Blood Product Expiration Date: 202107272359
Blood Product Expiration Date: 202107272359
Blood Product Expiration Date: 202107272359
Blood Product Expiration Date: 202107272359
Blood Product Expiration Date: 202107272359
Blood Product Expiration Date: 202107272359
Blood Product Expiration Date: 202107272359
ISSUE DATE / TIME: 202107222057
ISSUE DATE / TIME: 202107222057
ISSUE DATE / TIME: 202107222057
ISSUE DATE / TIME: 202107222057
ISSUE DATE / TIME: 202107222144
ISSUE DATE / TIME: 202107222144
ISSUE DATE / TIME: 202107222144
ISSUE DATE / TIME: 202107222144
Unit Type and Rh: 6200
Unit Type and Rh: 6200
Unit Type and Rh: 6200
Unit Type and Rh: 6200
Unit Type and Rh: 7300
Unit Type and Rh: 7300
Unit Type and Rh: 7300
Unit Type and Rh: 7300

## 2020-03-18 LAB — POCT I-STAT 7, (LYTES, BLD GAS, ICA,H+H)
Acid-base deficit: 3 mmol/L — ABNORMAL HIGH (ref 0.0–2.0)
Bicarbonate: 23.7 mmol/L (ref 20.0–28.0)
Calcium, Ion: 1.1 mmol/L — ABNORMAL LOW (ref 1.15–1.40)
HCT: 36 % — ABNORMAL LOW (ref 39.0–52.0)
Hemoglobin: 12.2 g/dL — ABNORMAL LOW (ref 13.0–17.0)
O2 Saturation: 99 %
Patient temperature: 97.5
Potassium: 3.3 mmol/L — ABNORMAL LOW (ref 3.5–5.1)
Sodium: 142 mmol/L (ref 135–145)
TCO2: 25 mmol/L (ref 22–32)
pCO2 arterial: 47 mmHg (ref 32.0–48.0)
pH, Arterial: 7.307 — ABNORMAL LOW (ref 7.350–7.450)
pO2, Arterial: 167 mmHg — ABNORMAL HIGH (ref 83.0–108.0)

## 2020-03-18 LAB — PREPARE FRESH FROZEN PLASMA
Unit division: 0
Unit division: 0
Unit division: 0
Unit division: 0
Unit division: 0

## 2020-03-18 LAB — URINALYSIS, ROUTINE W REFLEX MICROSCOPIC
Bacteria, UA: NONE SEEN
Bilirubin Urine: NEGATIVE
Glucose, UA: >=500 mg/dL — AB
Ketones, ur: NEGATIVE mg/dL
Leukocytes,Ua: NEGATIVE
Nitrite: NEGATIVE
Protein, ur: NEGATIVE mg/dL
Specific Gravity, Urine: 1.011 (ref 1.005–1.030)
pH: 6 (ref 5.0–8.0)

## 2020-03-18 LAB — TRIGLYCERIDES: Triglycerides: 96 mg/dL (ref <150)

## 2020-03-18 LAB — BASIC METABOLIC PANEL
Anion gap: 11 (ref 5–15)
BUN: 9 mg/dL (ref 6–20)
CO2: 21 mmol/L — ABNORMAL LOW (ref 22–32)
Calcium: 7.7 mg/dL — ABNORMAL LOW (ref 8.9–10.3)
Chloride: 108 mmol/L (ref 98–111)
Creatinine, Ser: 0.88 mg/dL (ref 0.61–1.24)
GFR calc Af Amer: >60 mL/min (ref >60)
GFR calc non Af Amer: >60 mL/min (ref >60)
Glucose, Bld: 280 mg/dL — ABNORMAL HIGH (ref 70–99)
Potassium: 3.4 mmol/L — ABNORMAL LOW (ref 3.5–5.1)
Sodium: 140 mmol/L (ref 135–145)

## 2020-03-18 LAB — HIV ANTIBODY (ROUTINE TESTING W REFLEX): HIV Screen 4th Generation wRfx: NONREACTIVE

## 2020-03-18 LAB — GLUCOSE, CAPILLARY
Glucose-Capillary: 187 mg/dL — ABNORMAL HIGH (ref 70–99)
Glucose-Capillary: 167 mg/dL — ABNORMAL HIGH (ref 70–99)
Glucose-Capillary: 135 mg/dL — ABNORMAL HIGH (ref 70–99)
Glucose-Capillary: 133 mg/dL — ABNORMAL HIGH (ref 70–99)
Glucose-Capillary: 101 mg/dL — ABNORMAL HIGH (ref 70–99)

## 2020-03-18 LAB — MRSA PCR SCREENING: MRSA by PCR: POSITIVE — AB

## 2020-03-18 LAB — BLOOD PRODUCT ORDER (VERBAL) VERIFICATION

## 2020-03-18 LAB — CBC
HCT: 38.9 % — ABNORMAL LOW (ref 39.0–52.0)
Hemoglobin: 12.5 g/dL — ABNORMAL LOW (ref 13.0–17.0)
MCH: 28 pg (ref 26.0–34.0)
MCHC: 32.1 g/dL (ref 30.0–36.0)
MCV: 87 fL (ref 80.0–100.0)
Platelets: 154 10*3/uL (ref 150–400)
RBC: 4.47 MIL/uL (ref 4.22–5.81)
RDW: 16.2 % — ABNORMAL HIGH (ref 11.5–15.5)
WBC: 11.2 10*3/uL — ABNORMAL HIGH (ref 4.0–10.5)
nRBC: 0 % (ref 0.0–0.2)

## 2020-03-18 LAB — HEMOGLOBIN A1C
Hgb A1c MFr Bld: 5.9 % — ABNORMAL HIGH (ref 4.8–5.6)
Mean Plasma Glucose: 122.63 mg/dL

## 2020-03-18 MED ORDER — SODIUM CHLORIDE 0.9 % IR SOLN
Status: DC | PRN
  Administered 2020-03-18: 6000 mL

## 2020-03-18 MED ORDER — ORAL CARE MOUTH RINSE
15.0000 mL | OROMUCOSAL | Status: DC
  Administered 2020-03-18 (×2): 15 mL via OROMUCOSAL

## 2020-03-18 MED ORDER — FENTANYL 2500MCG IN NS 250ML (10MCG/ML) PREMIX INFUSION: 50.0000 ug/h | INTRAVENOUS | Status: DC

## 2020-03-18 MED ORDER — POLYETHYLENE GLYCOL 3350 17 G PO PACK
17.0000 g | PACK | Freq: Every day | ORAL | Status: DC
  Administered 2020-03-18 – 2020-03-23 (×4): 17 g via ORAL
  Filled 2020-03-18 (×6): qty 1

## 2020-03-18 MED ORDER — FENTANYL CITRATE (PF) 100 MCG/2ML IJ SOLN
50.0000 ug | Freq: Once | INTRAMUSCULAR | Status: AC
  Administered 2020-03-18: 50 ug via INTRAVENOUS
  Filled 2020-03-18: qty 2

## 2020-03-18 MED ORDER — POTASSIUM CHLORIDE 2 MEQ/ML IV SOLN
INTRAVENOUS | Status: DC
  Filled 2020-03-18 (×12): qty 1000

## 2020-03-18 MED ORDER — ALUM & MAG HYDROXIDE-SIMETH 200-200-20 MG/5ML PO SUSP
30.0000 mL | ORAL | Status: DC | PRN
  Administered 2020-03-18 – 2020-03-21 (×4): 30 mL via ORAL
  Filled 2020-03-18 (×7): qty 30

## 2020-03-18 MED ORDER — FENTANYL BOLUS VIA INFUSION
50.0000 ug | INTRAVENOUS | Status: DC | PRN
  Filled 2020-03-18: qty 50

## 2020-03-18 MED ORDER — PROPOFOL 500 MG/50ML IV EMUL
INTRAVENOUS | Status: DC | PRN
  Administered 2020-03-18: 40 ug/kg/min via INTRAVENOUS

## 2020-03-18 MED ORDER — OXYCODONE HCL 5 MG PO TABS
5.0000 mg | ORAL_TABLET | ORAL | Status: DC | PRN
  Administered 2020-03-18 – 2020-03-24 (×16): 10 mg via ORAL
  Filled 2020-03-18 (×17): qty 2

## 2020-03-18 MED ORDER — CHLORHEXIDINE GLUCONATE CLOTH 2 % EX PADS
6.0000 | MEDICATED_PAD | Freq: Every day | CUTANEOUS | Status: DC
  Administered 2020-03-18 – 2020-03-24 (×6): 6 via TOPICAL

## 2020-03-18 MED ORDER — SODIUM CHLORIDE 0.9 % IV SOLN
2.0000 g | INTRAVENOUS | Status: DC
  Administered 2020-03-18 – 2020-03-19 (×2): 2 g via INTRAVENOUS
  Filled 2020-03-18 (×2): qty 2

## 2020-03-18 MED ORDER — SODIUM CHLORIDE 0.9% FLUSH
10.0000 mL | Freq: Two times a day (BID) | INTRAVENOUS | Status: DC
  Administered 2020-03-18 – 2020-03-24 (×9): 10 mL

## 2020-03-18 MED ORDER — ENOXAPARIN SODIUM 30 MG/0.3ML ~~LOC~~ SOLN
30.0000 mg | Freq: Two times a day (BID) | SUBCUTANEOUS | Status: DC
  Administered 2020-03-18 – 2020-03-24 (×11): 30 mg via SUBCUTANEOUS
  Filled 2020-03-18 (×11): qty 0.3

## 2020-03-18 MED ORDER — DIPHENHYDRAMINE HCL 50 MG/ML IJ SOLN: 25.0000 mg | Freq: Once | INTRAMUSCULAR | Status: AC

## 2020-03-18 MED ORDER — CHLORHEXIDINE GLUCONATE 0.12% ORAL RINSE (MEDLINE KIT)
15.0000 mL | Freq: Two times a day (BID) | OROMUCOSAL | Status: DC
  Administered 2020-03-18 (×2): 15 mL via OROMUCOSAL

## 2020-03-18 MED ORDER — ASPIRIN EC 81 MG PO TBEC
81.0000 mg | DELAYED_RELEASE_TABLET | Freq: Every day | ORAL | Status: DC
  Administered 2020-03-18 – 2020-03-24 (×7): 81 mg via ORAL
  Filled 2020-03-18 (×7): qty 1

## 2020-03-18 MED ORDER — DIPHENHYDRAMINE HCL 50 MG/ML IJ SOLN
INTRAMUSCULAR | Status: AC
  Administered 2020-03-18: 25 mg via INTRAVENOUS
  Filled 2020-03-18: qty 1

## 2020-03-18 MED ORDER — PROPOFOL 1000 MG/100ML IV EMUL
0.0000 ug/kg/min | INTRAVENOUS | Status: DC
  Administered 2020-03-18: 20 ug/kg/min via INTRAVENOUS
  Filled 2020-03-18: qty 100

## 2020-03-18 MED ORDER — MUPIROCIN 2 % EX OINT
1.0000 "application " | TOPICAL_OINTMENT | Freq: Two times a day (BID) | CUTANEOUS | Status: AC
  Administered 2020-03-18 – 2020-03-22 (×7): 1 via NASAL
  Filled 2020-03-18 (×2): qty 22

## 2020-03-18 MED ORDER — CLONAZEPAM 0.5 MG PO TABS
0.5000 mg | ORAL_TABLET | Freq: Two times a day (BID) | ORAL | Status: DC
  Administered 2020-03-18 – 2020-03-24 (×12): 0.5 mg via ORAL
  Filled 2020-03-18 (×12): qty 1

## 2020-03-18 MED ORDER — SODIUM CHLORIDE 0.9% FLUSH: 10.0000 mL | INTRAVENOUS | Status: DC | PRN

## 2020-03-18 MED ORDER — DOCUSATE SODIUM 50 MG/5ML PO LIQD: 100.0000 mg | Freq: Two times a day (BID) | ORAL | Status: DC

## 2020-03-18 MED ORDER — INSULIN ASPART 100 UNIT/ML ~~LOC~~ SOLN
0.0000 [IU] | SUBCUTANEOUS | Status: DC
  Administered 2020-03-18 (×3): 3 [IU] via SUBCUTANEOUS
  Administered 2020-03-18 – 2020-03-19 (×2): 2 [IU] via SUBCUTANEOUS

## 2020-03-18 MED ORDER — POTASSIUM CHLORIDE 10 MEQ/50ML IV SOLN
10.0000 meq | INTRAVENOUS | Status: AC
  Administered 2020-03-18 (×2): 10 meq via INTRAVENOUS
  Filled 2020-03-18 (×2): qty 50
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
## 2020-03-18 NOTE — Progress Notes (Signed)
Patient ID: Michael Price, male   DOB: 02-27-1976, 44 y.o.   MRN: [id number] C spine cleared. Has tolerated extubation well.  Violeta Gelinas, MD, MPH, FACS Please use [email or web address] to contact on call provider

## 2020-03-18 NOTE — Progress Notes (Signed)
Orthopedic Tech Progress Note Patient Details:  [name] 01-09-1976 [id number] Adjusted the length of the rope holding traction per nurses request. The rope was too long for the height of the patients bed and would touch the floor when lowered. I cut the rope shorter while holding manual traction and then reapplied the weights with the assistance of the nurse. Patient ID: Michael Price, male   DOB: [birth year], 44 y.o.   MRN: [id number]   [name] 03/18/2020, 8:41 AM
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
## 2020-03-18 NOTE — Progress Notes (Addendum)
PT Cancellation Note  Patient Details Name: Michael Price MRN: [id number] DOB: Dec 24, 1975   Cancelled Treatment:    Reason Eval/Treat Not Completed: Patient not medically ready; noted pt in traction following R femoral neck fx/dislocation & closed reduction.  Will attempt again when cleared to mobilize.   Elray Mcgregor 03/18/2020, 8:57 AM  Sheran Lawless, PT Acute Rehabilitation Services Pager:[phone] Office:[phone] 03/18/2020

## 2020-03-18 NOTE — Progress Notes (Signed)
Transported to ct and back with no complications
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
## 2020-03-18 NOTE — TOC CAGE-AID Note (Signed)
Transition of Care [facility]) - CAGE-AID Screening   Patient Details  Name: Michael Price MRN: [id number] Date of Birth: [birth year]  Transition of Care [facility]) CM/SW Contact:    Jimmy Picket, LCSWA Phone Number: 03/18/2020, 2:10 PM   Clinical Narrative:  Pt unable to participate in assessment due to being recently extubated. Pt has no documented history of alcohol use or substance sue.   CAGE-AID Screening: Substance Abuse Screening unable to be completed due to: : Patient unable to participate                   Isabella Stalling Clinical Social Worker [phone]

## 2020-03-18 NOTE — Procedures (Signed)
Extubation Procedure Note  Patient Details:   Name: Michael Price DOB: Jan 13, 1976 MRN: [id number]   Airway Documentation:    Vent end date: 03/18/20 Vent end time: 0818   Evaluation  O2 sats: stable throughout Complications: No apparent complications Patient did tolerate procedure well. Bilateral Breath Sounds: Clear, Diminished   Yes   Patient extubated per MD order. Positive cuff leak. No stridor noted. Vitals are stable on 2L . RN at bedside.  [name] H Jemari Hallum 03/18/2020, 8:22 AM

## 2020-03-18 NOTE — Anesthesia Procedure Notes (Signed)
Central Venous Catheter Insertion Performed by: Kipp Brood, MD Start/End7/22/2021 10:00 PM, 03/17/2020 10:05 PM Position: supine Hand hygiene performed  and maximum sterile barriers used  Catheter size: 12 Fr Total catheter length 15. Central line was placed.Triple lumen Procedure performed without using ultrasound guided technique. Attempts: 1 Following insertion, line sutured, dressing applied and Biopatch. Post procedure assessment: blood return through all ports, free fluid flow and no air

## 2020-03-18 NOTE — Anesthesia Postprocedure Evaluation (Signed)
Anesthesia Post Note  Patient: Michael Price  Procedure(s) Performed: REPAIR TRANSECTED BRACHIAL ARTERY WITH INTERPOSITION VEIN GRAFT FROM LEFT THIGH (Left ) LEFT HUMERUS IRRIGATION AND DEBRIDEMENT (Left Arm Upper) CLOSED REDUCTION RIGHT HIP WITH RIGHT LEG SKELEETAL TRACTION PINNING (Hip)     Patient location during evaluation: SICU Anesthesia Type: General Level of consciousness: sedated and patient remains intubated per anesthesia plan Pain management: pain level controlled Vital Signs Assessment: post-procedure vital signs reviewed and stable Respiratory status: patient remains intubated per anesthesia plan and patient on ventilator - see flowsheet for VS Cardiovascular status: stable Postop Assessment: no apparent nausea or vomiting Anesthetic complications: no   No complications documented.  Last Vitals:  Vitals:   03/17/20 1955 03/18/20 0045  BP: (!) 146/86 (!) 138/77  Pulse: 95 (!) 107  Resp: 22 18  Temp:  (!) 36.4 C  SpO2: 100% 100%    Last Pain:  Vitals:   03/18/20 0045  TempSrc: Oral  PainSc:     LLE Motor Response: No movement to painful stimulus (03/18/20 0045)   RLE Motor Response: No movement to painful stimulus (03/18/20 0045)        Aaryanna Hyden COKER

## 2020-03-18 NOTE — Transfer of Care (Signed)
Immediate Anesthesia Transfer of Care Note  Patient: Michael Price  Procedure(s) Performed: REPAIR TRANSECTED BRACHIAL ARTERY WITH INTERPOSITION VEIN GRAFT FROM LEFT THIGH (Left ) LEFT HUMERUS IRRIGATION AND DEBRIDEMENT (Left Arm Upper) CLOSED REDUCTION RIGHT HIP WITH RIGHT LEG SKELEETAL TRACTION PINNING (Hip)  Patient Location: ICU  Anesthesia Type:General  Level of Consciousness: Patient remains intubated per anesthesia plan  Airway & Oxygen Therapy: Patient remains intubated per anesthesia plan and Patient placed on Ventilator (see vital sign flow sheet for setting)  Post-op Assessment: Report given to RN and Post -op Vital signs reviewed and stable  Post vital signs: Reviewed and stable  Last Vitals:  Vitals Value Taken Time  BP 134/72 03/18/20 0030  Temp    Pulse 107 03/18/20 0044  Resp 18 03/18/20 0044  SpO2 100 % 03/18/20 0044  Vitals shown include unvalidated device data.  Last Pain:  Vitals:   03/17/20 1911  PainSc: 10-Worst pain ever         Complications: No complications documented.

## 2020-03-18 NOTE — Progress Notes (Signed)
OT Cancellation Note  Patient Details Name: AGUSTUS MANE MRN: [id number] DOB: 04-02-1976   Cancelled Treatment:    Reason Eval/Treat Not Completed: Patient not medically ready.  Pt in traction and plan for OR tomorrow for humeral ORIF.  Per PA notes, bedrest today. Will check back.Eber Jones., OTR/L Acute Rehabilitation Services Pager [phone] Office [phone]   Jeani Hawking M 03/18/2020, 10:20 AM

## 2020-03-19 ENCOUNTER — Encounter (HOSPITAL_COMMUNITY): Payer: Self-pay

## 2020-03-19 ENCOUNTER — Encounter (HOSPITAL_COMMUNITY): Admission: EM | Disposition: A | Discharge status: Home / Self Care | Payer: Self-pay | Source: Home / Self Care

## 2020-03-19 ENCOUNTER — Inpatient Hospital Stay (HOSPITAL_COMMUNITY): Payer: No Typology Code available for payment source

## 2020-03-19 ENCOUNTER — Inpatient Hospital Stay (HOSPITAL_COMMUNITY): Payer: No Typology Code available for payment source | Admitting: Certified Registered Nurse Anesthetist

## 2020-03-19 HISTORY — PX: ORIF HUMERUS FRACTURE: SHX2126

## 2020-03-19 HISTORY — PX: I & D EXTREMITY: SHX5045

## 2020-03-19 LAB — GLUCOSE, CAPILLARY
Glucose-Capillary: 113 mg/dL — ABNORMAL HIGH (ref 70–99)
Glucose-Capillary: 99 mg/dL (ref 70–99)
Glucose-Capillary: 106 mg/dL — ABNORMAL HIGH (ref 70–99)
Glucose-Capillary: 128 mg/dL — ABNORMAL HIGH (ref 70–99)

## 2020-03-19 LAB — CBC
HCT: 34.9 % — ABNORMAL LOW (ref 39.0–52.0)
Hemoglobin: 11.4 g/dL — ABNORMAL LOW (ref 13.0–17.0)
MCH: 28.9 pg (ref 26.0–34.0)
MCHC: 32.7 g/dL (ref 30.0–36.0)
MCV: 88.6 fL (ref 80.0–100.0)
Platelets: 165 10*3/uL (ref 150–400)
RBC: 3.94 MIL/uL — ABNORMAL LOW (ref 4.22–5.81)
RDW: 16.1 % — ABNORMAL HIGH (ref 11.5–15.5)
WBC: 11 10*3/uL — ABNORMAL HIGH (ref 4.0–10.5)
nRBC: 0 % (ref 0.0–0.2)

## 2020-03-19 LAB — VITAMIN D 25 HYDROXY (VIT D DEFICIENCY, FRACTURES): Vit D, 25-Hydroxy: 14.52 ng/mL — ABNORMAL LOW (ref 30–100)

## 2020-03-19 LAB — TRIGLYCERIDES: Triglycerides: 101 mg/dL (ref <150)

## 2020-03-19 SURGERY — OPEN REDUCTION INTERNAL FIXATION (ORIF) HUMERAL SHAFT FRACTURE
Anesthesia: General | Site: Arm Upper | Laterality: Left

## 2020-03-19 MED ORDER — PROMETHAZINE HCL 25 MG/ML IJ SOLN: 6.2500 mg | INTRAMUSCULAR | Status: DC | PRN

## 2020-03-19 MED ORDER — CHLORHEXIDINE GLUCONATE 0.12 % MT SOLN
OROMUCOSAL | Status: AC
  Administered 2020-03-19: 15 mL via OROMUCOSAL
  Filled 2020-03-19: qty 15

## 2020-03-19 MED ORDER — SODIUM CHLORIDE 0.9 % IV SOLN
INTRAVENOUS | Status: DC | PRN
Start: 2020-03-19 — End: 2020-03-19

## 2020-03-19 MED ORDER — LIDOCAINE 2% (20 MG/ML) 5 ML SYRINGE
INTRAMUSCULAR | Status: AC
  Filled 2020-03-19: qty 5

## 2020-03-19 MED ORDER — OXYCODONE HCL 5 MG/5ML PO SOLN: 5.0000 mg | Freq: Once | ORAL | Status: DC | PRN

## 2020-03-19 MED ORDER — OXYCODONE HCL 5 MG PO TABS: 5.0000 mg | ORAL_TABLET | Freq: Once | ORAL | Status: DC | PRN

## 2020-03-19 MED ORDER — ACETAMINOPHEN 10 MG/ML IV SOLN
INTRAVENOUS | Status: AC
  Filled 2020-03-19: qty 100

## 2020-03-19 MED ORDER — HYDROMORPHONE HCL 1 MG/ML IJ SOLN
INTRAMUSCULAR | Status: AC
  Filled 2020-03-19: qty 1

## 2020-03-19 MED ORDER — PROPOFOL 10 MG/ML IV BOLUS
INTRAVENOUS | Status: DC | PRN
  Administered 2020-03-19: 150 mg via INTRAVENOUS

## 2020-03-19 MED ORDER — DEXAMETHASONE SODIUM PHOSPHATE 10 MG/ML IJ SOLN
INTRAMUSCULAR | Status: DC | PRN
  Administered 2020-03-19: 10 mg via INTRAVENOUS

## 2020-03-19 MED ORDER — FENTANYL CITRATE (PF) 250 MCG/5ML IJ SOLN
INTRAMUSCULAR | Status: AC
  Filled 2020-03-19: qty 5

## 2020-03-19 MED ORDER — LIDOCAINE 2% (20 MG/ML) 5 ML SYRINGE
INTRAMUSCULAR | Status: DC | PRN
  Administered 2020-03-19: 60 mg via INTRAVENOUS

## 2020-03-19 MED ORDER — ROCURONIUM BROMIDE 10 MG/ML (PF) SYRINGE
PREFILLED_SYRINGE | INTRAVENOUS | Status: DC | PRN
  Administered 2020-03-19 (×2): 10 mg via INTRAVENOUS
  Administered 2020-03-19: 20 mg via INTRAVENOUS
  Administered 2020-03-19: 60 mg via INTRAVENOUS
  Administered 2020-03-19: 10 mg via INTRAVENOUS
  Administered 2020-03-19: 30 mg via INTRAVENOUS

## 2020-03-19 MED ORDER — SODIUM CHLORIDE 0.9 % IV SOLN
2.0000 g | INTRAVENOUS | Status: AC
  Administered 2020-03-20 – 2020-03-22 (×3): 2 g via INTRAVENOUS
  Filled 2020-03-19 (×3): qty 20

## 2020-03-19 MED ORDER — ACETAMINOPHEN 10 MG/ML IV SOLN
INTRAVENOUS | Status: DC | PRN
Start: 2020-03-19 — End: 2020-03-19
  Administered 2020-03-19: 1000 mg via INTRAVENOUS

## 2020-03-19 MED ORDER — ACETAMINOPHEN 10 MG/ML IV SOLN: 1000.0000 mg | Freq: Once | INTRAVENOUS | Status: DC | PRN

## 2020-03-19 MED ORDER — PROPOFOL 10 MG/ML IV BOLUS
INTRAVENOUS | Status: AC
  Filled 2020-03-19: qty 20

## 2020-03-19 MED ORDER — ONDANSETRON HCL 4 MG/2ML IJ SOLN
INTRAMUSCULAR | Status: AC
  Filled 2020-03-19: qty 2

## 2020-03-19 MED ORDER — SUGAMMADEX SODIUM 200 MG/2ML IV SOLN
INTRAVENOUS | Status: DC | PRN
  Administered 2020-03-19: 300 mg via INTRAVENOUS

## 2020-03-19 MED ORDER — ONDANSETRON HCL 4 MG/2ML IJ SOLN
INTRAMUSCULAR | Status: DC | PRN
  Administered 2020-03-19: 4 mg via INTRAVENOUS

## 2020-03-19 MED ORDER — HYDROMORPHONE HCL 1 MG/ML IJ SOLN
0.2500 mg | INTRAMUSCULAR | Status: DC | PRN
  Administered 2020-03-19 (×2): 0.5 mg via INTRAVENOUS

## 2020-03-19 MED ORDER — FENTANYL CITRATE (PF) 100 MCG/2ML IJ SOLN
INTRAMUSCULAR | Status: DC | PRN
  Administered 2020-03-19: 50 ug via INTRAVENOUS
  Administered 2020-03-19: 25 ug via INTRAVENOUS
  Administered 2020-03-19 (×2): 50 ug via INTRAVENOUS
  Administered 2020-03-19 (×5): 25 ug via INTRAVENOUS
  Administered 2020-03-19: 50 ug via INTRAVENOUS

## 2020-03-19 MED ORDER — ALBUMIN HUMAN 5 % IV SOLN: INTRAVENOUS | Status: DC | PRN

## 2020-03-19 MED ORDER — MIDAZOLAM HCL 2 MG/2ML IJ SOLN
INTRAMUSCULAR | Status: AC
  Filled 2020-03-19: qty 2

## 2020-03-19 MED ORDER — ROCURONIUM BROMIDE 10 MG/ML (PF) SYRINGE
PREFILLED_SYRINGE | INTRAVENOUS | Status: AC
  Filled 2020-03-19: qty 10

## 2020-03-19 MED ORDER — MIDAZOLAM HCL 5 MG/5ML IJ SOLN
INTRAMUSCULAR | Status: DC | PRN
  Administered 2020-03-19: 2 mg via INTRAVENOUS

## 2020-03-19 MED ORDER — DEXAMETHASONE SODIUM PHOSPHATE 10 MG/ML IJ SOLN
INTRAMUSCULAR | Status: AC
  Filled 2020-03-19: qty 1

## 2020-03-19 MED ORDER — CHLORHEXIDINE GLUCONATE 0.12 % MT SOLN
15.0000 mL | Freq: Once | OROMUCOSAL | Status: AC
  Filled 2020-03-19: qty 15

## 2020-03-19 MED ORDER — 0.9 % SODIUM CHLORIDE (POUR BTL) OPTIME
TOPICAL | Status: DC | PRN
  Administered 2020-03-19: 2000 mL
  Administered 2020-03-19: 1000 mL

## 2020-03-19 SURGICAL SUPPLY — 103 items
APL SKNCLS STERI-STRIP NONHPOA (GAUZE/BANDAGES/DRESSINGS) ×2
BENZOIN TINCTURE PRP APPL 2/3 (GAUZE/BANDAGES/DRESSINGS) ×6 IMPLANT
BIT DRILL 2.5X2.75 QC CALB (BIT) ×2 IMPLANT
BIT DRILL 3.2 (BIT) ×3
BIT DRILL 3.2XCALB NS DISP (BIT) IMPLANT
BIT DRILL 3.5X5.5 QC CALB (BIT) ×2 IMPLANT
BIT DRILL CALIBRATED 2.7 (BIT) ×1 IMPLANT
BIT DRILL CALIBRATED 2.7MM (BIT) ×1
BIT DRL 3.2XCALB NS DISP (BIT) ×1
BNDG CMPR 9X4 STRL LF SNTH (GAUZE/BANDAGES/DRESSINGS) ×1
BNDG COHESIVE 4X5 TAN STRL (GAUZE/BANDAGES/DRESSINGS) ×3 IMPLANT
BNDG ESMARK 4X9 LF (GAUZE/BANDAGES/DRESSINGS) ×3 IMPLANT
BNDG GAUZE ELAST 4 BULKY (GAUZE/BANDAGES/DRESSINGS) ×6 IMPLANT
BRUSH SCRUB EZ PLAIN DRY (MISCELLANEOUS) ×6 IMPLANT
BUR EGG ELITE 5.0 (BURR) ×1 IMPLANT
BUR EGG ELITE 5.0MM (BURR) ×1
CORD BIPOLAR FORCEPS 12FT (ELECTRODE) ×3 IMPLANT
COVER SURGICAL LIGHT HANDLE (MISCELLANEOUS) ×6 IMPLANT
COVER WAND RF STERILE (DRAPES) ×3 IMPLANT
DRAPE C-ARM 42X72 X-RAY (DRAPES) ×3 IMPLANT
DRAPE ORTHO SPLIT 77X108 STRL (DRAPES) ×6
DRAPE SURG ORHT 6 SPLT 77X108 (DRAPES) ×2 IMPLANT
DRAPE U-SHAPE 47X51 STRL (DRAPES) ×6 IMPLANT
DRSG ADAPTIC 3X8 NADH LF (GAUZE/BANDAGES/DRESSINGS) ×3 IMPLANT
DRSG MEPITEL 4X7.2 (GAUZE/BANDAGES/DRESSINGS) ×2 IMPLANT
DRSG MEPITEL 8X12 (GAUZE/BANDAGES/DRESSINGS) ×1 IMPLANT
DRSG PAD ABDOMINAL 8X10 ST (GAUZE/BANDAGES/DRESSINGS) ×3 IMPLANT
ELECT REM PT RETURN 9FT ADLT (ELECTROSURGICAL) ×3
ELECTRODE REM PT RTRN 9FT ADLT (ELECTROSURGICAL) ×1 IMPLANT
EVACUATOR 1/8 PVC DRAIN (DRAIN) IMPLANT
GAUZE SPONGE 4X4 12PLY STRL (GAUZE/BANDAGES/DRESSINGS) ×4 IMPLANT
GLOVE BIO SURGEON STRL SZ7.5 (GLOVE) ×7 IMPLANT
GLOVE BIO SURGEON STRL SZ8 (GLOVE) ×7 IMPLANT
GLOVE BIOGEL PI IND STRL 7.5 (GLOVE) ×1 IMPLANT
GLOVE BIOGEL PI IND STRL 8 (GLOVE) ×1 IMPLANT
GLOVE BIOGEL PI IND STRL 9 (GLOVE) ×1 IMPLANT
GLOVE BIOGEL PI INDICATOR 7.5 (GLOVE) ×6
GLOVE BIOGEL PI INDICATOR 8 (GLOVE) ×2
GLOVE BIOGEL PI INDICATOR 9 (GLOVE) ×2
GOWN STRL REUS W/ TWL LRG LVL3 (GOWN DISPOSABLE) ×2 IMPLANT
GOWN STRL REUS W/ TWL XL LVL3 (GOWN DISPOSABLE) ×1 IMPLANT
GOWN STRL REUS W/TWL LRG LVL3 (GOWN DISPOSABLE) ×12
GOWN STRL REUS W/TWL XL LVL3 (GOWN DISPOSABLE) ×3
HANDPIECE INTERPULSE COAX TIP (DISPOSABLE)
HEMOSTAT ARISTA ABSORB 3G PWDR (HEMOSTASIS) ×2 IMPLANT
K-WIRE 2X5 SS THRDED S3 (WIRE) ×6
KIT BASIN OR (CUSTOM PROCEDURE TRAY) ×3 IMPLANT
KIT TURNOVER KIT B (KITS) ×3 IMPLANT
KWIRE 2X5 SS THRDED S3 (WIRE) IMPLANT
MANIFOLD NEPTUNE II (INSTRUMENTS) ×3 IMPLANT
MEPITEL 8X12 (GAUZE/BANDAGES/DRESSINGS) ×1
NDL HYPO 25X1 1.5 SAFETY (NEEDLE) ×1 IMPLANT
NEEDLE HYPO 25X1 1.5 SAFETY (NEEDLE) ×3 IMPLANT
NS IRRIG 1000ML POUR BTL (IV SOLUTION) ×3 IMPLANT
PACK ORTHO EXTREMITY (CUSTOM PROCEDURE TRAY) ×3 IMPLANT
PAD ABD 8X10 STRL (GAUZE/BANDAGES/DRESSINGS) ×4 IMPLANT
PAD ARMBOARD 7.5X6 YLW CONV (MISCELLANEOUS) ×6 IMPLANT
PADDING CAST COTTON 6X4 STRL (CAST SUPPLIES) ×3 IMPLANT
PEG LOCKING 3.2MMX44 (Peg) ×2 IMPLANT
PEG LOCKING 3.2MMX54 (Peg) ×4 IMPLANT
PEG LOCKING 3.2X40 (Peg) ×2 IMPLANT
PEG LOCKING 3.2X42 (Screw) ×2 IMPLANT
PLATE ACE 3.5MM 4HOLE (Plate) ×2 IMPLANT
PLATE PROX HUM LT 14H LOW (Plate) ×2 IMPLANT
SCREW CORTICAL 3.5MM  12MM (Screw) ×3 IMPLANT
SCREW CORTICAL 3.5MM  30MM (Screw) ×3 IMPLANT
SCREW CORTICAL 3.5MM 12MM (Screw) IMPLANT
SCREW CORTICAL 3.5MM 24MM (Screw) ×4 IMPLANT
SCREW CORTICAL 3.5MM 30MM (Screw) IMPLANT
SCREW LOCK CORT STAR 3.5X20 (Screw) ×2 IMPLANT
SCREW LOCK CORT STAR 3.5X24 (Screw) ×2 IMPLANT
SCREW LOW PROF TIS 3.5X28MM (Screw) ×4 IMPLANT
SCREW LOW PROFILE 3.5X30MM TIS (Screw) ×4 IMPLANT
SCREW LP NL T15 3.5X20 (Screw) ×2 IMPLANT
SCREW LP NL T15 3.5X22 (Screw) ×2 IMPLANT
SET HNDPC FAN SPRY TIP SCT (DISPOSABLE) IMPLANT
SLEEVE MEASURING 3.2 (BIT) ×2 IMPLANT
SLING ARM IMMOBILIZER LRG (SOFTGOODS) ×2 IMPLANT
SOL PREP POV-IOD 4OZ 10% (MISCELLANEOUS) ×3 IMPLANT
SOL PREP PROV IODINE SCRUB 4OZ (MISCELLANEOUS) ×3 IMPLANT
SPONGE LAP 18X18 RF (DISPOSABLE) ×3 IMPLANT
STAPLER VISISTAT 35W (STAPLE) ×3 IMPLANT
STOCKINETTE IMPERVIOUS 9X36 MD (GAUZE/BANDAGES/DRESSINGS) IMPLANT
SUCTION FRAZIER HANDLE 10FR (MISCELLANEOUS) ×3
SUCTION TUBE FRAZIER 10FR DISP (MISCELLANEOUS) ×1 IMPLANT
SUT BONE WAX W31G (SUTURE) ×2 IMPLANT
SUT ETHILON 3 0 PS 1 (SUTURE) ×6 IMPLANT
SUT PDS AB 2-0 CT1 27 (SUTURE) IMPLANT
SUT VIC AB 0 CT1 27 (SUTURE) ×6
SUT VIC AB 0 CT1 27XBRD ANBCTR (SUTURE) ×2 IMPLANT
SUT VIC AB 2-0 CT1 27 (SUTURE) ×6
SUT VIC AB 2-0 CT1 TAPERPNT 27 (SUTURE) ×2 IMPLANT
SYR 5ML LL (SYRINGE) IMPLANT
SYR CONTROL 10ML LL (SYRINGE) ×3 IMPLANT
TAPE CLOTH SURG 6X10 WHT LF (GAUZE/BANDAGES/DRESSINGS) ×2 IMPLANT
TOWEL GREEN STERILE (TOWEL DISPOSABLE) ×9 IMPLANT
TOWEL GREEN STERILE FF (TOWEL DISPOSABLE) ×3 IMPLANT
TRAY FOLEY MTR SLVR 16FR STAT (SET/KITS/TRAYS/PACK) IMPLANT
TUBE CONNECTING 12'X1/4 (SUCTIONS) ×1
TUBE CONNECTING 12X1/4 (SUCTIONS) ×2 IMPLANT
UNDERPAD 30X36 HEAVY ABSORB (UNDERPADS AND DIAPERS) ×3 IMPLANT
WATER STERILE IRR 1000ML POUR (IV SOLUTION) ×3 IMPLANT
YANKAUER SUCT BULB TIP NO VENT (SUCTIONS) ×3 IMPLANT
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
## 2020-03-19 NOTE — Anesthesia Procedure Notes (Signed)
Procedure Name: Intubation Date/Time: 03/19/2020 11:17 AM Performed by: Dairl Ponder, CRNA Pre-anesthesia Checklist: Patient identified, Emergency Drugs available, Suction available, Patient being monitored and Timeout performed Patient Re-evaluated:Patient Re-evaluated prior to induction Oxygen Delivery Method: Circle system utilized Preoxygenation: Pre-oxygenation with 100% oxygen Induction Type: IV induction Ventilation: Mask ventilation without difficulty Laryngoscope Size: Glidescope and 4 Grade View: Grade I Tube type: Oral Tube size: 7.5 mm Number of attempts: 1 Airway Equipment and Method: Stylet Placement Confirmation: ETT inserted through vocal cords under direct vision,  positive ETCO2 and breath sounds checked- equal and bilateral Secured at: 23 cm Tube secured with: Tape Dental Injury: Teeth and Oropharynx as per pre-operative assessment

## 2020-03-19 NOTE — Anesthesia Postprocedure Evaluation (Signed)
Anesthesia Post Note  Patient: Michael Price  Procedure(s) Performed: OPEN REDUCTION INTERNAL FIXATION (ORIF) HUMERAL SHAFT FRACTURE (Left Arm Upper) IRRIGATION AND DEBRIDEMENT EXTREMITY (Left Arm Upper)     Patient location during evaluation: PACU Anesthesia Type: General Level of consciousness: awake Pain management: pain level controlled Vital Signs Assessment: post-procedure vital signs reviewed and stable Respiratory status: spontaneous breathing, nonlabored ventilation, respiratory function stable and patient connected to nasal cannula oxygen Cardiovascular status: blood pressure returned to baseline and stable Postop Assessment: no apparent nausea or vomiting Anesthetic complications: no   No complications documented.  Last Vitals:  Vitals:   03/19/20 1549 03/19/20 1555  BP: (!) 136/90   Pulse: 93   Resp: 15   Temp:  36.8 C  SpO2: 98%     Last Pain:  Vitals:   03/19/20 1555  TempSrc:   PainSc: Asleep                 Raynard Mapps P Valbona Slabach

## 2020-03-19 NOTE — Transfer of Care (Signed)
Immediate Anesthesia Transfer of Care Note  Patient: Michael Price  Procedure(s) Performed: OPEN REDUCTION INTERNAL FIXATION (ORIF) HUMERAL SHAFT FRACTURE (Left Arm Upper) IRRIGATION AND DEBRIDEMENT EXTREMITY (Left Arm Upper)  Patient Location: PACU  Anesthesia Type:General  Level of Consciousness: sedated  Airway & Oxygen Therapy: Patient Spontanous Breathing and Patient connected to nasal cannula oxygen  Post-op Assessment: Report given to RN and Post -op Vital signs reviewed and stable  Post vital signs: Reviewed and stable  Last Vitals:  Vitals Value Taken Time  BP    Temp    Pulse    Resp    SpO2      Last Pain:  Vitals:   03/19/20 0850  TempSrc:   PainSc: Asleep         Complications: No complications documented.

## 2020-03-19 NOTE — Progress Notes (Signed)
Patient moved by bed to room 3 on 5N with this RN and Maisie Fus, Charity fundraiser. Patient alert with no distress noted.  Winfield Rast, RN at bedside upon transfer.

## 2020-03-19 NOTE — Progress Notes (Signed)
Report called to Vara Guardian, RN on 5N.  Patient will be moved to room 3.
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
## 2020-03-19 NOTE — Progress Notes (Signed)
Paged Dr. Janee Morn and he called back.  RN asked about patient moving to floor and MD gave order to discontinue tele orders and move patient to 5 [place].

## 2020-03-19 NOTE — Progress Notes (Signed)
PT Cancellation Note  Patient Details Name: Michael Price MRN: [id number] DOB: 16-Aug-1976   Cancelled Treatment:    Reason Eval/Treat Not Completed: Patient not medically ready. Per Rufina Falco note pt going to OR On 7/24 for orif to L UE and for pin removal of R LE. Plan at this time is to treat R LE no-op but will be TTWB and on posterior hip precautions. PT/OT to start s/p surgery on 7/25. PT to return as able, as appropriate s/p surgery.  Lewis Shock, PT, DPT Acute Rehabilitation Services Pager #: [phone] Office #: [phone]    Iona Hansen 03/19/2020, 7:41 AM
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
## 2020-03-20 LAB — GLUCOSE, CAPILLARY
Glucose-Capillary: 103 mg/dL — ABNORMAL HIGH (ref 70–99)
Glucose-Capillary: 106 mg/dL — ABNORMAL HIGH (ref 70–99)
Glucose-Capillary: 108 mg/dL — ABNORMAL HIGH (ref 70–99)
Glucose-Capillary: 109 mg/dL — ABNORMAL HIGH (ref 70–99)
Glucose-Capillary: 113 mg/dL — ABNORMAL HIGH (ref 70–99)
Glucose-Capillary: 113 mg/dL — ABNORMAL HIGH (ref 70–99)

## 2020-03-20 LAB — BASIC METABOLIC PANEL
Anion gap: 6 (ref 5–15)
BUN: 14 mg/dL (ref 6–20)
CO2: 23 mmol/L (ref 22–32)
Calcium: 7.7 mg/dL — ABNORMAL LOW (ref 8.9–10.3)
Chloride: 110 mmol/L (ref 98–111)
Creatinine, Ser: 0.84 mg/dL (ref 0.61–1.24)
GFR calc Af Amer: >60 mL/min (ref >60)
GFR calc non Af Amer: >60 mL/min (ref >60)
Glucose, Bld: 130 mg/dL — ABNORMAL HIGH (ref 70–99)
Potassium: 4.1 mmol/L (ref 3.5–5.1)
Sodium: 139 mmol/L (ref 135–145)

## 2020-03-20 LAB — CBC
HCT: 27.4 % — ABNORMAL LOW (ref 39.0–52.0)
Hemoglobin: 8.8 g/dL — ABNORMAL LOW (ref 13.0–17.0)
MCH: 28 pg (ref 26.0–34.0)
MCHC: 32.1 g/dL (ref 30.0–36.0)
MCV: 87.3 fL (ref 80.0–100.0)
Platelets: 168 10*3/uL (ref 150–400)
RBC: 3.14 MIL/uL — ABNORMAL LOW (ref 4.22–5.81)
RDW: 15.6 % — ABNORMAL HIGH (ref 11.5–15.5)
WBC: 8.8 10*3/uL (ref 4.0–10.5)
nRBC: 0 % (ref 0.0–0.2)

## 2020-03-20 MED ORDER — VITAMIN D 25 MCG (1000 UNIT) PO TABS
2000.0000 [IU] | ORAL_TABLET | Freq: Two times a day (BID) | ORAL | Status: DC
  Administered 2020-03-20 – 2020-03-24 (×8): 2000 [IU] via ORAL
  Filled 2020-03-20 (×8): qty 2

## 2020-03-20 MED ORDER — VITAMIN D (ERGOCALCIFEROL) 1.25 MG (50000 UNIT) PO CAPS
50000.0000 [IU] | ORAL_CAPSULE | ORAL | Status: DC
  Administered 2020-03-21: 50000 [IU] via ORAL
  Filled 2020-03-20: qty 1

## 2020-03-20 MED ORDER — ADULT MULTIVITAMIN W/MINERALS CH
1.0000 | ORAL_TABLET | Freq: Every day | ORAL | Status: DC
  Administered 2020-03-21 – 2020-03-24 (×4): 1 via ORAL
  Filled 2020-03-20 (×4): qty 1

## 2020-03-20 MED ORDER — ASCORBIC ACID 500 MG PO TABS
500.0000 mg | ORAL_TABLET | Freq: Every day | ORAL | Status: DC
  Administered 2020-03-21 – 2020-03-24 (×4): 500 mg via ORAL
  Filled 2020-03-20 (×4): qty 1
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
## 2020-03-20 NOTE — Plan of Care (Signed)
  Problem: Pain Managment: Goal: General experience of comfort will improve Outcome: Progressing   Problem: Safety: Goal: Ability to remain free from injury will improve Outcome: Progressing   Problem: Skin Integrity: Goal: Risk for impaired skin integrity will decrease Outcome: Progressing   

## 2020-03-20 NOTE — Plan of Care (Signed)

## 2020-03-20 NOTE — Progress Notes (Signed)
Inpatient Rehab Admissions Coordinator Note:   Per PT/OT recommendations, pt was screened for CIR candidacy by Wolfgang Phoenix, MS, CCC-SLP.  At this time we are recommending and Inpatient Rehab consult.  AC will place consult order per protocol.   Please contact me with questions.   Wolfgang Phoenix, MS, CCC-SLP Admissions Coordinator [phone] 03/20/20 3:47 PM
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
## 2020-03-20 NOTE — Plan of Care (Signed)
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
## 2020-03-21 ENCOUNTER — Encounter (HOSPITAL_COMMUNITY): Payer: Self-pay | Admitting: Orthopedic Surgery

## 2020-03-21 LAB — TYPE AND SCREEN
ABO/RH(D): O POS
Antibody Screen: NEGATIVE
Unit division: 0
Unit division: 0
Unit division: 0
Unit division: 0
Unit division: 0
Unit division: 0
Unit division: 0
Unit division: 0
Unit division: 0
Unit division: 0
Unit division: 0
Unit division: 0
Unit division: 0
Unit division: 0
Unit division: 0
Unit division: 0

## 2020-03-21 LAB — BPAM RBC
Blood Product Expiration Date: 202108242359
Blood Product Expiration Date: 202108242359
Blood Product Expiration Date: 202108242359
Blood Product Expiration Date: 202108242359
Blood Product Expiration Date: 202108242359
Blood Product Expiration Date: 202108242359
Blood Product Expiration Date: 202108242359
Blood Product Expiration Date: 202108242359
Blood Product Expiration Date: 202108242359
Blood Product Expiration Date: 202108242359
Blood Product Expiration Date: 202108242359
Blood Product Expiration Date: 202108242359
Blood Product Expiration Date: 202108242359
Blood Product Expiration Date: 202108242359
Blood Product Expiration Date: 202108242359
Blood Product Expiration Date: 202108242359
ISSUE DATE / TIME: 202107222003
ISSUE DATE / TIME: 202107222003
ISSUE DATE / TIME: 202107222003
ISSUE DATE / TIME: 202107222003
ISSUE DATE / TIME: 202107222058
ISSUE DATE / TIME: 202107222058
ISSUE DATE / TIME: 202107222058
ISSUE DATE / TIME: 202107222058
ISSUE DATE / TIME: 202107222145
ISSUE DATE / TIME: 202107222145
ISSUE DATE / TIME: 202107222145
ISSUE DATE / TIME: 202107231953
ISSUE DATE / TIME: 202107241333
ISSUE DATE / TIME: 202107241457
Unit Type and Rh: 5100
Unit Type and Rh: 5100
Unit Type and Rh: 5100
Unit Type and Rh: 5100
Unit Type and Rh: 5100
Unit Type and Rh: 5100
Unit Type and Rh: 5100
Unit Type and Rh: 5100
Unit Type and Rh: 5100
Unit Type and Rh: 5100
Unit Type and Rh: 5100
Unit Type and Rh: 5100
Unit Type and Rh: 5100
Unit Type and Rh: 5100
Unit Type and Rh: 5100
Unit Type and Rh: 5100

## 2020-03-21 LAB — CBC
HCT: 28 % — ABNORMAL LOW (ref 39.0–52.0)
Hemoglobin: 9.2 g/dL — ABNORMAL LOW (ref 13.0–17.0)
MCH: 28.8 pg (ref 26.0–34.0)
MCHC: 32.9 g/dL (ref 30.0–36.0)
MCV: 87.8 fL (ref 80.0–100.0)
Platelets: 206 10*3/uL (ref 150–400)
RBC: 3.19 MIL/uL — ABNORMAL LOW (ref 4.22–5.81)
RDW: 15.1 % (ref 11.5–15.5)
WBC: 8.4 10*3/uL (ref 4.0–10.5)
nRBC: 0.2 % (ref 0.0–0.2)

## 2020-03-21 LAB — BASIC METABOLIC PANEL
Anion gap: 8 (ref 5–15)
BUN: 15 mg/dL (ref 6–20)
CO2: 22 mmol/L (ref 22–32)
Calcium: 8 mg/dL — ABNORMAL LOW (ref 8.9–10.3)
Chloride: 107 mmol/L (ref 98–111)
Creatinine, Ser: 0.73 mg/dL (ref 0.61–1.24)
GFR calc Af Amer: >60 mL/min (ref >60)
GFR calc non Af Amer: >60 mL/min (ref >60)
Glucose, Bld: 108 mg/dL — ABNORMAL HIGH (ref 70–99)
Potassium: 3.5 mmol/L (ref 3.5–5.1)
Sodium: 137 mmol/L (ref 135–145)

## 2020-03-21 LAB — MAGNESIUM: Magnesium: 1.9 mg/dL (ref 1.7–2.4)

## 2020-03-21 LAB — GLUCOSE, CAPILLARY
Glucose-Capillary: 90 mg/dL (ref 70–99)
Glucose-Capillary: 106 mg/dL — ABNORMAL HIGH (ref 70–99)

## 2020-03-21 MED ORDER — ONDANSETRON 4 MG PO TBDP: 4.0000 mg | ORAL_TABLET | ORAL | Status: DC | PRN

## 2020-03-21 MED ORDER — ACETAMINOPHEN 500 MG PO TABS
1000.0000 mg | ORAL_TABLET | Freq: Four times a day (QID) | ORAL | Status: DC
  Administered 2020-03-21 (×3): 1000 mg via ORAL
  Administered 2020-03-22: 500 mg via ORAL
  Administered 2020-03-22 – 2020-03-24 (×8): 1000 mg via ORAL
  Filled 2020-03-21 (×12): qty 2

## 2020-03-21 MED ORDER — METHOCARBAMOL 500 MG PO TABS
750.0000 mg | ORAL_TABLET | Freq: Three times a day (TID) | ORAL | Status: DC
  Administered 2020-03-21 (×3): 750 mg via ORAL
  Filled 2020-03-21 (×3): qty 2

## 2020-03-21 MED ORDER — MORPHINE SULFATE (PF) 2 MG/ML IV SOLN
2.0000 mg | INTRAVENOUS | Status: DC | PRN
  Administered 2020-03-21 – 2020-03-22 (×4): 2 mg via INTRAVENOUS
  Filled 2020-03-21 (×4): qty 1

## 2020-03-21 MED ORDER — TRAMADOL HCL 50 MG PO TABS
50.0000 mg | ORAL_TABLET | Freq: Four times a day (QID) | ORAL | Status: DC | PRN
  Administered 2020-03-22: 50 mg via ORAL
  Filled 2020-03-21: qty 1

## 2020-03-21 MED ORDER — ONDANSETRON HCL 4 MG/2ML IJ SOLN: 4.0000 mg | INTRAMUSCULAR | Status: DC | PRN
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
## 2020-03-21 NOTE — Progress Notes (Signed)
Inpatient Rehabilitation Admissions Coordinator  Inpatient rehab consult received. I met with patient at bedside for rehab assessment. We discussed goals and expectations of a possible CIR admit. Patient states preference for direct d/c home , but would like to speak to his Mom first. His 43 year old brother, Edison Nasuti, can assist when his mother works, but born with mutism. I attempted to clarify medical insurance for patient works, but he was not forthcoming. I will follow up tomorrow.  Danne Baxter, RN, MSN Rehab Admissions Coordinator [phone] 03/21/2020 1:01 PM
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
## 2020-03-21 NOTE — Plan of Care (Signed)
  Problem: Education: Goal: Knowledge of General Education information will improve Description Including pain rating scale, medication(s)/side effects and non-pharmacologic comfort measures Outcome: Progressing   Problem: Health Behavior/Discharge Planning: Goal: Ability to manage health-related needs will improve Outcome: Progressing   
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
## 2020-03-22 ENCOUNTER — Encounter (HOSPITAL_COMMUNITY): Payer: Self-pay | Admitting: Orthopedic Surgery

## 2020-03-22 MED ORDER — GABAPENTIN 300 MG PO CAPS
300.0000 mg | ORAL_CAPSULE | Freq: Three times a day (TID) | ORAL | Status: DC
  Administered 2020-03-22 – 2020-03-24 (×7): 300 mg via ORAL
  Filled 2020-03-22 (×7): qty 1

## 2020-03-22 MED ORDER — MORPHINE SULFATE (PF) 2 MG/ML IV SOLN
2.0000 mg | Freq: Three times a day (TID) | INTRAVENOUS | Status: DC | PRN
  Administered 2020-03-22 – 2020-03-24 (×3): 2 mg via INTRAVENOUS
  Filled 2020-03-22 (×3): qty 1

## 2020-03-22 MED ORDER — METOPROLOL TARTRATE 5 MG/5ML IV SOLN: 5.0000 mg | Freq: Three times a day (TID) | INTRAVENOUS | Status: DC | PRN

## 2020-03-22 MED ORDER — TRAMADOL HCL 50 MG PO TABS
100.0000 mg | ORAL_TABLET | Freq: Four times a day (QID) | ORAL | Status: DC | PRN
  Administered 2020-03-23 – 2020-03-24 (×2): 100 mg via ORAL
  Filled 2020-03-22 (×2): qty 2

## 2020-03-22 MED ORDER — METHOCARBAMOL 500 MG PO TABS
1000.0000 mg | ORAL_TABLET | Freq: Three times a day (TID) | ORAL | Status: DC
  Administered 2020-03-22 – 2020-03-24 (×7): 1000 mg via ORAL
  Filled 2020-03-22 (×7): qty 2

## 2020-03-22 MED ORDER — KETOROLAC TROMETHAMINE 10 MG PO TABS
10.0000 mg | ORAL_TABLET | Freq: Four times a day (QID) | ORAL | Status: DC
  Administered 2020-03-22 – 2020-03-24 (×8): 10 mg via ORAL
  Filled 2020-03-22 (×10): qty 1

## 2020-03-22 NOTE — Progress Notes (Signed)
Inpatient Rehabilitation Admissions Coordinator  I met with patient at bedside. He continues to express that he would like direct d/c home with family and HH.  Danne Baxter, RN, MSN Rehab Admissions Coordinator [phone] 03/22/2020 12:17 PM
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
## 2020-03-22 NOTE — Plan of Care (Signed)
°  Problem: Education: °Goal: Knowledge of General Education information will improve °Description: Including pain rating scale, medication(s)/side effects and non-pharmacologic comfort measures °Outcome: Progressing °  °Problem: Health Behavior/Discharge Planning: °Goal: Ability to manage health-related needs will improve °Outcome: Progressing °  °Problem: Clinical Measurements: °Goal: Will remain free from infection °Outcome: Progressing °  °Problem: Activity: °Goal: Risk for activity intolerance will decrease °Outcome: Progressing °  °Problem: Nutrition: °Goal: Adequate nutrition will be maintained °Outcome: Progressing °  °Problem: Coping: °Goal: Level of anxiety will decrease °Outcome: Progressing °  °Problem: Elimination: °Goal: Will not experience complications related to bowel motility °Outcome: Progressing °  °Problem: Pain Managment: °Goal: General experience of comfort will improve °Outcome: Progressing °  °Problem: Safety: °Goal: Ability to remain free from injury will improve °Outcome: Progressing °  °Problem: Skin Integrity: °Goal: Risk for impaired skin integrity will decrease °Outcome: Progressing °  °

## 2020-03-22 NOTE — Plan of Care (Signed)
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
## 2020-03-23 NOTE — Plan of Care (Signed)
  Problem: Health Behavior/Discharge Planning: Goal: Ability to manage health-related needs will improve Outcome: Progressing   Problem: Education: Goal: Knowledge of General Education information will improve Description: Including pain rating scale, medication(s)/side effects and non-pharmacologic comfort measures Outcome: Progressing   Problem: Clinical Measurements: Goal: Ability to maintain clinical measurements within normal limits will improve Outcome: Progressing Goal: Will remain free from infection Outcome: Progressing   Problem: Clinical Measurements: Goal: Will remain free from infection Outcome: Progressing
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
## 2020-03-23 NOTE — Progress Notes (Signed)
Inpatient Rehabilitation Admissions Coordinator  Noted patient has progressed with therapy recommendations for [facility] now recommended. I will sign off at this time.  Ottie Glazier, RN, MSN Rehab Admissions Coordinator [phone] 03/23/2020 11:14 AM

## 2020-03-23 NOTE — Plan of Care (Signed)

## 2020-03-23 NOTE — TOC Progression Note (Signed)
Transition of Care [facility]) - Progression Note    Patient Details  Name: Michael Price MRN: [id number] Date of Birth: 06-23-1976  Transition of Care [facility]) CM/SW Contact  Astrid Drafts Berna Spare, RN Phone Number: 03/23/2020, 3:53 PM  Clinical Narrative: Spoke with patient this morning regarding discharge arrangements; PT/OT recommending home health services at discharge.  Patient states he has Medicaid, currently not listed on chart.  Patient states he will have his mother bring his Medicaid card, so that we can update his account.  Currently, no home health provider is excepting Medicaid for home health services, but we can arrange outpatient services.  Patient agreeable to outpatient therapies; prefers outpatient therapy in [place], near his home.  Will follow up with patient in the morning to get a copy of his Medicaid card and arrange DME.  Awaiting occupational therapy consult for DME recommendations.    Expected Discharge Plan: OP Rehab Barriers to Discharge: Continued Medical Work up  Expected Discharge Plan and Services Expected Discharge Plan: OP Rehab   Discharge Planning Services: CM Consult   Living arrangements for the past 2 months: Single Family Home                                       Social Determinants of Health (SDOH) Interventions    Readmission Risk Interventions No flowsheet data found.  Quintella Baton, RN, BSN  Trauma/Neuro ICU Case Manager [phone]
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
## 2020-03-23 NOTE — Plan of Care (Signed)
  Problem: Education: Goal: Knowledge of General Education information will improve Description: Including pain rating scale, medication(s)/side effects and non-pharmacologic comfort measures Outcome: Progressing   Problem: Health Behavior/Discharge Planning: Goal: Ability to manage health-related needs will improve Outcome: Progressing   Problem: Clinical Measurements: Goal: Ability to maintain clinical measurements within normal limits will improve Outcome: Progressing   Problem: Activity: Goal: Risk for activity intolerance will decrease Outcome: Progressing   Problem: Coping: Goal: Level of anxiety will decrease Outcome: Progressing   Problem: Elimination: Goal: Will not experience complications related to bowel motility Outcome: Progressing   Problem: Pain Managment: Goal: General experience of comfort will improve Outcome: Progressing   Problem: Safety: Goal: Ability to remain free from injury will improve Outcome: Progressing   
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
## 2020-03-24 ENCOUNTER — Encounter: Payer: Self-pay | Admitting: Family Medicine

## 2020-03-24 MED ORDER — METHOCARBAMOL 500 MG PO TABS: 500.0000 mg | ORAL_TABLET | Freq: Three times a day (TID) | ORAL | 0 refills | Status: DC | PRN

## 2020-03-24 MED ORDER — ASPIRIN 81 MG PO TBEC: 81.0000 mg | DELAYED_RELEASE_TABLET | Freq: Every day | ORAL | 11 refills | Status: AC

## 2020-03-24 MED ORDER — ONDANSETRON 4 MG PO TBDP: 4.0000 mg | ORAL_TABLET | Freq: Three times a day (TID) | ORAL | 0 refills | Status: DC | PRN

## 2020-03-24 MED ORDER — POLYETHYLENE GLYCOL 3350 17 G PO PACK: 17.0000 g | PACK | Freq: Every day | ORAL | 0 refills | Status: DC

## 2020-03-24 MED ORDER — ADULT MULTIVITAMIN W/MINERALS CH: 1.0000 | ORAL_TABLET | Freq: Every day | ORAL | 3 refills | Status: DC

## 2020-03-24 MED ORDER — OXYCODONE-ACETAMINOPHEN 7.5-325 MG PO TABS: 1.0000 | ORAL_TABLET | Freq: Four times a day (QID) | ORAL | 0 refills | Status: DC | PRN

## 2020-03-24 MED ORDER — ENOXAPARIN SODIUM 40 MG/0.4ML ~~LOC~~ SOLN
40.0000 mg | SUBCUTANEOUS | 0 refills | Status: DC
Start: 2020-03-24 — End: 2020-04-14

## 2020-03-24 MED ORDER — VITAMIN D (ERGOCALCIFEROL) 1.25 MG (50000 UNIT) PO CAPS: 50000.0000 [IU] | ORAL_CAPSULE | ORAL | 0 refills | Status: AC

## 2020-03-24 MED ORDER — VITAMIN D3 25 MCG PO TABS: 2000.0000 [IU] | ORAL_TABLET | Freq: Two times a day (BID) | ORAL | 11 refills | Status: DC

## 2020-03-24 MED ORDER — DOCUSATE SODIUM 100 MG PO CAPS: 100.0000 mg | ORAL_CAPSULE | Freq: Two times a day (BID) | ORAL | 0 refills | Status: AC

## 2020-03-24 MED ORDER — ACETAMINOPHEN 500 MG PO TABS: 500.0000 mg | ORAL_TABLET | Freq: Two times a day (BID) | ORAL | 0 refills | Status: DC

## 2020-03-24 MED ORDER — GABAPENTIN 300 MG PO CAPS: 300.0000 mg | ORAL_CAPSULE | Freq: Three times a day (TID) | ORAL | 0 refills | Status: DC

## 2020-03-24 MED ORDER — TRAMADOL HCL 50 MG PO TABS: 50.0000 mg | ORAL_TABLET | Freq: Three times a day (TID) | ORAL | 0 refills | Status: DC | PRN

## 2020-03-24 MED ORDER — ASCORBIC ACID 500 MG PO TABS: 500.0000 mg | ORAL_TABLET | Freq: Every day | ORAL | 2 refills | Status: DC

## 2020-03-24 MED FILL — ASPIRIN LOW DOSE 81 MG TBEC: 81 | 30 days supply | Qty: 30 | Fill #0

## 2020-03-24 MED FILL — traMADol HCL 50 MG TABS: 50 | 10 days supply | Qty: 30 | Fill #0

## 2020-03-24 MED FILL — DOCUSATE SODIUM 100 MG CAPS: 100 | 15 days supply | Qty: 30 | Fill #0

## 2020-03-24 MED FILL — METHOCARBAMOL 500 MG TABS: 500 | 12 days supply | Qty: 60 | Fill #0

## 2020-03-24 MED FILL — ONDANSETRON ODT 4 MG TABLET: 4 | 6 days supply | Qty: 20 | Fill #0

## 2020-03-24 MED FILL — ACETAMINOPHEN 500MG XT STRE: 500 | 30 days supply | Qty: 60 | Fill #0

## 2020-03-24 MED FILL — ENOXAPARIN SODIUM 40 MG/0.4: 40 | 21 days supply | Qty: 8 | Fill #0

## 2020-03-24 MED FILL — VIT D2 1.25 MG (50,000 UNIT: 1.25 MG | 56 days supply | Qty: 8 | Fill #0

## 2020-03-24 MED FILL — GABAPENTIN 300 MG CAPSULE: 300 | 14 days supply | Qty: 42 | Fill #0

## 2020-03-24 MED FILL — VITAMIN D3 1,000 UNIT TAB: 25 MCG | 30 days supply | Qty: 120 | Fill #0

## 2020-03-24 MED FILL — OXYCODONE-APAP 7.5-325MG: 7.5-325 | 8 days supply | Qty: 50 | Fill #0

## 2020-03-24 MED FILL — VITAMIN C 500 MG TABLET: 500 | 30 days supply | Qty: 30 | Fill #0

## 2020-03-24 NOTE — Plan of Care (Signed)
  Problem: Education: Goal: Knowledge of General Education information will improve Description: Including pain rating scale, medication(s)/side effects and non-pharmacologic comfort measures Outcome: Progressing   Problem: Health Behavior/Discharge Planning: Goal: Ability to manage health-related needs will improve Outcome: Progressing   Problem: Clinical Measurements: Goal: Will remain free from infection Outcome: Progressing   Problem: Activity: Goal: Risk for activity intolerance will decrease Outcome: Progressing   Problem: Nutrition: Goal: Adequate nutrition will be maintained Outcome: Progressing   Problem: Coping: Goal: Level of anxiety will decrease Outcome: Progressing   Problem: Pain Managment: Goal: General experience of comfort will improve Outcome: Progressing   Problem: Safety: Goal: Ability to remain free from injury will improve Outcome: Progressing   
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
## 2020-03-24 NOTE — Discharge Instructions (Signed)
Orthopaedic Trauma Service Discharge Instructions   General Discharge Instructions  Orthopaedic Injuries:  Right femoral head fracture dislocation treated with closed reduction              Open Left humerus fracture treated with open reduction and internal fixation using plate and screws   WEIGHT BEARING STATUS: Touch down weight bearing right leg with posterior hip precautions.  Nonweightbearing left arm, no lifting with left arm   RANGE OF MOTION/ACTIVITY: posterior hip precautions right hip.  No active shoulder or elbow motion.  Gentle passive motion of shoulder and elbow ok.  Sling at all times. Ok to remove sling when in bed or chair.    Bone health:  Labs show vitamin d deficiency. You have been prescribed 2 types of vitamin d supplements. Take both   Wound Care: daily wound care starting on 03/25/2020. Make sure wound stays as clean as possible.     Discharge Wound Care Instructions  Do NOT apply any ointments, solutions or lotions to pin sites or surgical wounds.  These prevent needed drainage and even though solutions like hydrogen peroxide kill bacteria, they also damage cells lining the pin sites that help fight infection.  Applying lotions or ointments can keep the wounds moist and can cause them to breakdown and open up as well. This can increase the risk for infection. When in doubt call the office.  Surgical incisions should be dressed daily starting on 03/25/2020  If any drainage is noted, use one layer of adaptic (oil emulsion gauze), then 4x4 gauze, abd pad and ace wrap.  Put an extra abd pad in your armpit to help absorb moisture   Once the incision is completely dry and without drainage, it may be left open to air out.  Showering may begin 36-48 hours later.  Cleaning gently with soap and water.  Traumatic wounds should be dressed daily as well.    One layer of adaptic, gauze, Kerlix, then ace wrap.  The adaptic can be discontinued once the draining has  ceased    If you have a wet to dry dressing: wet the gauze with saline the squeeze as much saline out so the gauze is moist (not soaking wet), place moistened gauze over wound, then place a dry gauze over the moist one, followed by Kerlix wrap, then ace wrap.   DVT/PE prophylaxis: Lovenox 40 mg subcutaneous injection daily x 21 days   Diet: as you were eating previously.  Can use over the counter stool softeners and bowel preparations, such as Miralax, to help with bowel movements.  Narcotics can be constipating.  Be sure to drink plenty of fluids  PAIN MEDICATION USE AND EXPECTATIONS  You have likely been given narcotic medications to help control your pain.  After a traumatic event that results in an fracture (broken bone) with or without surgery, it is ok to use narcotic pain medications to help control one's pain.  We understand that everyone responds to pain differently and each individual patient will be evaluated on a regular basis for the continued need for narcotic medications. Ideally, narcotic medication use should last no more than 6-8 weeks (coinciding with fracture healing).   As a patient it is your responsibility as well to monitor narcotic medication use and report the amount and frequency you use these medications when you come to your office visit.   We would also advise that if you are using narcotic medications, you should take a dose prior to therapy to  maximize you participation.  IF YOU ARE ON NARCOTIC MEDICATIONS IT IS NOT PERMISSIBLE TO OPERATE A MOTOR VEHICLE (MOTORCYCLE/CAR/TRUCK/MOPED) OR HEAVY MACHINERY DO NOT MIX NARCOTICS WITH OTHER CNS (CENTRAL NERVOUS SYSTEM) DEPRESSANTS SUCH AS ALCOHOL   STOP SMOKING OR USING NICOTINE PRODUCTS!!!!  As discussed nicotine severely impairs your body's ability to heal surgical and traumatic wounds but also impairs bone healing.  Wounds and bone heal by forming microscopic blood vessels (angiogenesis) and nicotine is a vasoconstrictor  (essentially, shrinks blood vessels).  Therefore, if vasoconstriction occurs to these microscopic blood vessels they essentially disappear and are unable to deliver necessary nutrients to the healing tissue.  This is one modifiable factor that you can do to dramatically increase your chances of healing your injury.    (This means no smoking, no nicotine gum, patches, etc)  DO NOT USE NONSTEROIDAL ANTI-INFLAMMATORY DRUGS (NSAID'S)  Using products such as Advil (ibuprofen), Aleve (naproxen), Motrin (ibuprofen) for additional pain control during fracture healing can delay and/or prevent the healing response.  If you would like to take over the counter (OTC) medication, Tylenol (acetaminophen) is ok.  However, some narcotic medications that are given for pain control contain acetaminophen as well. Therefore, you should not exceed more than 4000 mg of tylenol in a day if you do not have liver disease.  Also note that there are may OTC medicines, such as cold medicines and allergy medicines that my contain tylenol as well.  If you have any questions about medications and/or interactions please ask your doctor/PA or your pharmacist.      ICE AND ELEVATE INJURED/OPERATIVE EXTREMITY  Using ice and elevating the injured extremity above your heart can help with swelling and pain control.  Icing in a pulsatile fashion, such as 20 minutes on and 20 minutes off, can be followed.    Do not place ice directly on skin. Make sure there is a barrier between to skin and the ice pack.    Using frozen items such as frozen peas works well as the conform nicely to the are that needs to be iced.  USE AN ACE WRAP OR TED HOSE FOR SWELLING CONTROL  In addition to icing and elevation, Ace wraps or TED hose are used to help limit and resolve swelling.  It is recommended to use Ace wraps or TED hose until you are informed to stop.    When using Ace Wraps start the wrapping distally (farthest away from the body) and wrap proximally  (closer to the body)   Example: If you had surgery on your leg or thing and you do not have a splint on, start the ace wrap at the toes and work your way up to the thigh        If you had surgery on your upper extremity and do not have a splint on, start the ace wrap at your fingers and work your way up to the upper arm  IF YOU ARE IN A SPLINT OR CAST DO NOT REMOVE IT FOR ANY REASON   If your splint gets wet for any reason please contact the office immediately. You may shower in your splint or cast as long as you keep it dry.  This can be done by wrapping in a cast cover or garbage back (or similar)  Do Not stick any thing down your splint or cast such as pencils, money, or hangers to try and scratch yourself with.  If you feel itchy take benadryl as prescribed on the bottle  for itching  IF YOU ARE IN A CAM BOOT (BLACK BOOT)  You may remove boot periodically. Perform daily dressing changes as noted below.  Wash the liner of the boot regularly and wear a sock when wearing the boot. It is recommended that you sleep in the boot until told otherwise    Call office for the following:  Temperature greater than 101F  Persistent nausea and vomiting  Severe uncontrolled pain  Redness, tenderness, or signs of infection (pain, swelling, redness, odor or green/yellow discharge around the site)  Difficulty breathing, headache or visual disturbances  Hives  Persistent dizziness or light-headedness  Extreme fatigue  Any other questions or concerns you may have after discharge  In an emergency, call 911 or go to an Emergency Department at a nearby hospital   CALL THE OFFICE WITH ANY QUESTIONS OR CONCERNS: [phone]   VISIT OUR WEBSITE FOR ADDITIONAL INFORMATION: [email or web address]
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
## 2020-03-24 NOTE — Progress Notes (Signed)
Discharge summary packet provided to pt with instructions . Pt verbalized understanding of instructions including wound care management.,follow appointments.. No complaints voiced. Family is responsible for pt's transport.
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
## 2020-03-24 NOTE — TOC Transition Note (Signed)
Transition of Care [facility]) - CM/SW Discharge Note   Patient Details  Name: Michael Price MRN: [id number] Date of Birth: Aug 21, 1976  [facility]) CM/SW Contact:  Glennon Mac, RN Phone Number: 03/24/2020, 1:32 PM   Clinical Narrative: Patient medically stable for discharge home today with parents to provide assistance.  Referral to [employer] for recommended DME; wheelchair and 3 in 1 bedside commode to be delivered to bedside prior to discharge.  Referral to outpatient rehab center in [facility] for follow-up therapies, per patient request.  Rehab information placed on AVS.  Patient stated that he had Medicaid; checked with admissions office; patient only has family Medicaid, which only covers clinic visits and birth control.  This will not cover equipment or hospital stay; notified patient of this.    Final next level of care: OP Rehab Barriers to Discharge: Barriers Resolved                         Discharge Plan and Services   Discharge Planning Services: MATCH Program, Medication Assistance                                 Social Determinants of Health (SDOH) Interventions     Readmission Risk Interventions Readmission Risk Prevention Plan 03/24/2020  Post Dischage Appt Not Complete  Appt Comments Pt will have to schedule eligibility appt prior to PCP scheduled  Medication Screening Complete  Transportation Screening Complete   Quintella Baton, RN, BSN  Trauma/Neuro ICU Case Manager [phone]
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
## 2020-03-30 ENCOUNTER — Inpatient Hospital Stay (HOSPITAL_COMMUNITY)
Admission: EM | Admit: 2020-03-30 | Discharge: 2020-04-14 | DRG: 856 | Disposition: A | Discharge status: Home Health | Payer: Self-pay | Attending: Orthopedic Surgery | Admitting: Orthopedic Surgery

## 2020-03-30 ENCOUNTER — Other Ambulatory Visit: Payer: Self-pay

## 2020-03-30 ENCOUNTER — Encounter (HOSPITAL_COMMUNITY): Payer: Self-pay | Admitting: Emergency Medicine

## 2020-03-30 ENCOUNTER — Emergency Department (HOSPITAL_COMMUNITY): Payer: Self-pay

## 2020-03-30 DIAGNOSIS — L02414 Cutaneous abscess of left upper limb: Secondary | ICD-10-CM | POA: Diagnosis present

## 2020-03-30 DIAGNOSIS — Z419 Encounter for procedure for purposes other than remedying health state, unspecified: Secondary | ICD-10-CM

## 2020-03-30 DIAGNOSIS — Y792 Prosthetic and other implants, materials and accessory orthopedic devices associated with adverse incidents: Secondary | ICD-10-CM | POA: Diagnosis present

## 2020-03-30 DIAGNOSIS — M7989 Other specified soft tissue disorders: Secondary | ICD-10-CM | POA: Diagnosis present

## 2020-03-30 DIAGNOSIS — B9562 Methicillin resistant Staphylococcus aureus infection as the cause of diseases classified elsewhere: Secondary | ICD-10-CM | POA: Diagnosis present

## 2020-03-30 DIAGNOSIS — S42302B Unspecified fracture of shaft of humerus, left arm, initial encounter for open fracture: Secondary | ICD-10-CM | POA: Diagnosis present

## 2020-03-30 DIAGNOSIS — Y838 Other surgical procedures as the cause of abnormal reaction of the patient, or of later complication, without mention of misadventure at the time of the procedure: Secondary | ICD-10-CM | POA: Diagnosis present

## 2020-03-30 DIAGNOSIS — T148XXA Other injury of unspecified body region, initial encounter: Secondary | ICD-10-CM

## 2020-03-30 DIAGNOSIS — K30 Functional dyspepsia: Secondary | ICD-10-CM | POA: Diagnosis present

## 2020-03-30 DIAGNOSIS — T798XXA Other early complications of trauma, initial encounter: Secondary | ICD-10-CM | POA: Diagnosis present

## 2020-03-30 DIAGNOSIS — I96 Gangrene, not elsewhere classified: Secondary | ICD-10-CM | POA: Diagnosis present

## 2020-03-30 DIAGNOSIS — Z9889 Other specified postprocedural states: Secondary | ICD-10-CM

## 2020-03-30 DIAGNOSIS — S2249XA Multiple fractures of ribs, unspecified side, initial encounter for closed fracture: Secondary | ICD-10-CM | POA: Diagnosis present

## 2020-03-30 DIAGNOSIS — S42325A Nondisplaced transverse fracture of shaft of humerus, left arm, initial encounter for closed fracture: Secondary | ICD-10-CM | POA: Diagnosis present

## 2020-03-30 DIAGNOSIS — S72001A Fracture of unspecified part of neck of right femur, initial encounter for closed fracture: Secondary | ICD-10-CM | POA: Diagnosis present

## 2020-03-30 DIAGNOSIS — F411 Generalized anxiety disorder: Secondary | ICD-10-CM | POA: Diagnosis present

## 2020-03-30 DIAGNOSIS — Z20822 Contact with and (suspected) exposure to covid-19: Secondary | ICD-10-CM | POA: Diagnosis present

## 2020-03-30 DIAGNOSIS — Z79899 Other long term (current) drug therapy: Secondary | ICD-10-CM

## 2020-03-30 DIAGNOSIS — Z8781 Personal history of (healed) traumatic fracture: Secondary | ICD-10-CM

## 2020-03-30 DIAGNOSIS — Z6835 Body mass index (BMI) 35.0-35.9, adult: Secondary | ICD-10-CM

## 2020-03-30 DIAGNOSIS — S45109A Unspecified injury of brachial artery, unspecified side, initial encounter: Secondary | ICD-10-CM | POA: Diagnosis present

## 2020-03-30 DIAGNOSIS — E559 Vitamin D deficiency, unspecified: Secondary | ICD-10-CM | POA: Diagnosis present

## 2020-03-30 DIAGNOSIS — S42325B Nondisplaced transverse fracture of shaft of humerus, left arm, initial encounter for open fracture: Secondary | ICD-10-CM | POA: Diagnosis present

## 2020-03-30 DIAGNOSIS — M868X2 Other osteomyelitis, upper arm: Secondary | ICD-10-CM | POA: Diagnosis present

## 2020-03-30 DIAGNOSIS — T84611A Infection and inflammatory reaction due to internal fixation device of left humerus, initial encounter: Secondary | ICD-10-CM | POA: Diagnosis present

## 2020-03-30 DIAGNOSIS — T847XXA Infection and inflammatory reaction due to other internal orthopedic prosthetic devices, implants and grafts, initial encounter: Secondary | ICD-10-CM

## 2020-03-30 DIAGNOSIS — M62838 Other muscle spasm: Secondary | ICD-10-CM | POA: Diagnosis present

## 2020-03-30 DIAGNOSIS — L089 Local infection of the skin and subcutaneous tissue, unspecified: Secondary | ICD-10-CM

## 2020-03-30 DIAGNOSIS — T8149XA Infection following a procedure, other surgical site, initial encounter: Principal | ICD-10-CM | POA: Diagnosis present

## 2020-03-30 DIAGNOSIS — Z7982 Long term (current) use of aspirin: Secondary | ICD-10-CM

## 2020-03-30 DIAGNOSIS — D62 Acute posthemorrhagic anemia: Secondary | ICD-10-CM | POA: Diagnosis not present

## 2020-03-30 DIAGNOSIS — M869 Osteomyelitis, unspecified: Secondary | ICD-10-CM

## 2020-03-30 DIAGNOSIS — K219 Gastro-esophageal reflux disease without esophagitis: Secondary | ICD-10-CM | POA: Diagnosis present

## 2020-03-30 DIAGNOSIS — M6289 Other specified disorders of muscle: Secondary | ICD-10-CM | POA: Diagnosis present

## 2020-03-30 DIAGNOSIS — E669 Obesity, unspecified: Secondary | ICD-10-CM | POA: Diagnosis present

## 2020-03-30 LAB — COMPREHENSIVE METABOLIC PANEL
ALT: 28 U/L (ref 0–44)
AST: 21 U/L (ref 15–41)
Albumin: 3.5 g/dL (ref 3.5–5.0)
Alkaline Phosphatase: 101 U/L (ref 38–126)
Anion gap: 10 (ref 5–15)
BUN: 10 mg/dL (ref 6–20)
CO2: 26 mmol/L (ref 22–32)
Calcium: 8.7 mg/dL — ABNORMAL LOW (ref 8.9–10.3)
Chloride: 100 mmol/L (ref 98–111)
Creatinine, Ser: 0.9 mg/dL (ref 0.61–1.24)
GFR calc Af Amer: >60 mL/min (ref >60)
GFR calc non Af Amer: >60 mL/min (ref >60)
Glucose, Bld: 107 mg/dL — ABNORMAL HIGH (ref 70–99)
Potassium: 3.8 mmol/L (ref 3.5–5.1)
Sodium: 136 mmol/L (ref 135–145)
Total Bilirubin: 0.6 mg/dL (ref 0.3–1.2)
Total Protein: 7.4 g/dL (ref 6.5–8.1)

## 2020-03-30 LAB — CBC WITH DIFFERENTIAL/PLATELET
Abs Immature Granulocytes: 0.16 10*3/uL — ABNORMAL HIGH (ref 0.00–0.07)
Basophils Absolute: 0.1 10*3/uL (ref 0.0–0.1)
Basophils Relative: 1 %
Eosinophils Absolute: 0.4 10*3/uL (ref 0.0–0.5)
Eosinophils Relative: 4 %
HCT: 31.6 % — ABNORMAL LOW (ref 39.0–52.0)
Hemoglobin: 9.5 g/dL — ABNORMAL LOW (ref 13.0–17.0)
Immature Granulocytes: 2 %
Lymphocytes Relative: 14 %
Lymphs Abs: 1.6 10*3/uL (ref 0.7–4.0)
MCH: 27.9 pg (ref 26.0–34.0)
MCHC: 30.1 g/dL (ref 30.0–36.0)
MCV: 92.9 fL (ref 80.0–100.0)
Monocytes Absolute: 0.9 10*3/uL (ref 0.1–1.0)
Monocytes Relative: 8 %
Neutro Abs: 7.8 10*3/uL — ABNORMAL HIGH (ref 1.7–7.7)
Neutrophils Relative %: 71 %
Platelets: 693 10*3/uL — ABNORMAL HIGH (ref 150–400)
RBC: 3.4 MIL/uL — ABNORMAL LOW (ref 4.22–5.81)
RDW: 16.1 % — ABNORMAL HIGH (ref 11.5–15.5)
WBC: 10.9 10*3/uL — ABNORMAL HIGH (ref 4.0–10.5)
nRBC: 0 % (ref 0.0–0.2)

## 2020-03-30 LAB — URINALYSIS, ROUTINE W REFLEX MICROSCOPIC
Glucose, UA: NEGATIVE mg/dL
Hgb urine dipstick: NEGATIVE
Ketones, ur: NEGATIVE mg/dL
Leukocytes,Ua: NEGATIVE
Nitrite: NEGATIVE
Protein, ur: NEGATIVE mg/dL
Specific Gravity, Urine: 1.02 (ref 1.005–1.030)
pH: 6 (ref 5.0–8.0)

## 2020-03-30 LAB — LACTIC ACID, PLASMA: Lactic Acid, Venous: 1.5 mmol/L (ref 0.5–1.9)

## 2020-03-30 LAB — PROTIME-INR
INR: 1 (ref 0.8–1.2)
Prothrombin Time: 13.2 seconds (ref 11.4–15.2)
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
## 2020-03-30 NOTE — ED Triage Notes (Addendum)
Pt presents to ED POV. Pt c/o L arm pain. Pt had recent surgery to L arm. Surgical wound is red, inflammed and has purulent drainage coming from it. Wound also has large purulent area. Pt taking hydrocodone at home and not covering pain, reports hot flashes at home.

## 2020-03-31 ENCOUNTER — Emergency Department (HOSPITAL_COMMUNITY): Payer: Self-pay | Admitting: Certified Registered Nurse Anesthetist

## 2020-03-31 ENCOUNTER — Encounter (HOSPITAL_COMMUNITY): Payer: Self-pay | Admitting: Certified Registered Nurse Anesthetist

## 2020-03-31 ENCOUNTER — Emergency Department (HOSPITAL_COMMUNITY): Payer: Self-pay

## 2020-03-31 ENCOUNTER — Encounter (HOSPITAL_COMMUNITY)
Admission: EM | Disposition: A | Discharge status: Home Health | Payer: Self-pay | Source: Home / Self Care | Attending: Orthopedic Surgery

## 2020-03-31 DIAGNOSIS — M869 Osteomyelitis, unspecified: Secondary | ICD-10-CM | POA: Diagnosis present

## 2020-03-31 HISTORY — PX: APPLICATION OF WOUND VAC: SHX5189

## 2020-03-31 HISTORY — PX: I & D EXTREMITY: SHX5045

## 2020-03-31 LAB — CBC WITH DIFFERENTIAL/PLATELET
Abs Immature Granulocytes: 0.16 10*3/uL — ABNORMAL HIGH (ref 0.00–0.07)
Basophils Absolute: 0 10*3/uL (ref 0.0–0.1)
Basophils Relative: 0 %
Eosinophils Absolute: 0.3 10*3/uL (ref 0.0–0.5)
Eosinophils Relative: 4 %
HCT: 33.6 % — ABNORMAL LOW (ref 39.0–52.0)
Hemoglobin: 10.3 g/dL — ABNORMAL LOW (ref 13.0–17.0)
Immature Granulocytes: 2 %
Lymphocytes Relative: 17 %
Lymphs Abs: 1.6 10*3/uL (ref 0.7–4.0)
MCH: 28.6 pg (ref 26.0–34.0)
MCHC: 30.7 g/dL (ref 30.0–36.0)
MCV: 93.3 fL (ref 80.0–100.0)
Monocytes Absolute: 0.8 10*3/uL (ref 0.1–1.0)
Monocytes Relative: 9 %
Neutro Abs: 6.3 10*3/uL (ref 1.7–7.7)
Neutrophils Relative %: 68 %
Platelets: 686 10*3/uL — ABNORMAL HIGH (ref 150–400)
RBC: 3.6 MIL/uL — ABNORMAL LOW (ref 4.22–5.81)
RDW: 16.1 % — ABNORMAL HIGH (ref 11.5–15.5)
WBC: 9.2 10*3/uL (ref 4.0–10.5)
nRBC: 0 % (ref 0.0–0.2)

## 2020-03-31 LAB — SARS CORONAVIRUS 2 BY RT PCR (HOSPITAL ORDER, PERFORMED IN ~~LOC~~ HOSPITAL LAB): SARS Coronavirus 2: NEGATIVE

## 2020-03-31 LAB — LACTIC ACID, PLASMA: Lactic Acid, Venous: 1.5 mmol/L (ref 0.5–1.9)

## 2020-03-31 SURGERY — IRRIGATION AND DEBRIDEMENT EXTREMITY
Anesthesia: General | Site: Arm Upper | Laterality: Left

## 2020-03-31 MED ORDER — ONDANSETRON HCL 4 MG/2ML IJ SOLN
INTRAMUSCULAR | Status: DC | PRN
  Administered 2020-03-31: 4 mg via INTRAVENOUS

## 2020-03-31 MED ORDER — FENTANYL CITRATE (PF) 250 MCG/5ML IJ SOLN
INTRAMUSCULAR | Status: AC
  Filled 2020-03-31: qty 5

## 2020-03-31 MED ORDER — PIPERACILLIN-TAZOBACTAM 3.375 G IVPB
3.3750 g | Freq: Three times a day (TID) | INTRAVENOUS | Status: DC
  Administered 2020-03-31 – 2020-04-02 (×6): 3.375 g via INTRAVENOUS
  Filled 2020-03-31 (×6): qty 50

## 2020-03-31 MED ORDER — PROPOFOL 10 MG/ML IV BOLUS
INTRAVENOUS | Status: AC
  Filled 2020-03-31: qty 40

## 2020-03-31 MED ORDER — LACTATED RINGERS IV SOLN: INTRAVENOUS | Status: DC

## 2020-03-31 MED ORDER — ENOXAPARIN SODIUM 40 MG/0.4ML ~~LOC~~ SOLN
40.0000 mg | SUBCUTANEOUS | Status: DC
  Administered 2020-03-31: 40 mg via SUBCUTANEOUS
  Filled 2020-03-31: qty 0.4

## 2020-03-31 MED ORDER — ACETAMINOPHEN 160 MG/5ML PO SOLN: 325.0000 mg | Freq: Once | ORAL | Status: DC | PRN

## 2020-03-31 MED ORDER — SUGAMMADEX SODIUM 200 MG/2ML IV SOLN
INTRAVENOUS | Status: DC | PRN
  Administered 2020-03-31: 200 mg via INTRAVENOUS

## 2020-03-31 MED ORDER — HYDROMORPHONE HCL 1 MG/ML IJ SOLN
1.0000 mg | INTRAMUSCULAR | Status: DC | PRN
  Administered 2020-03-31 (×4): 1 mg via INTRAVENOUS
  Filled 2020-03-31 (×3): qty 1

## 2020-03-31 MED ORDER — ACETAMINOPHEN 325 MG PO TABS: 325.0000 mg | ORAL_TABLET | Freq: Once | ORAL | Status: DC | PRN

## 2020-03-31 MED ORDER — HYDROMORPHONE HCL 1 MG/ML IJ SOLN
INTRAMUSCULAR | Status: AC
  Filled 2020-03-31: qty 1

## 2020-03-31 MED ORDER — ONDANSETRON HCL 4 MG/2ML IJ SOLN
INTRAMUSCULAR | Status: AC
  Filled 2020-03-31: qty 2

## 2020-03-31 MED ORDER — CLONAZEPAM 0.5 MG PO TABS
0.5000 mg | ORAL_TABLET | Freq: Two times a day (BID) | ORAL | Status: DC | PRN
  Administered 2020-03-31 – 2020-04-13 (×21): 0.5 mg via ORAL
  Filled 2020-03-31 (×23): qty 1

## 2020-03-31 MED ORDER — ONDANSETRON HCL 4 MG/2ML IJ SOLN: 4.0000 mg | Freq: Four times a day (QID) | INTRAMUSCULAR | Status: DC | PRN

## 2020-03-31 MED ORDER — TRAMADOL HCL 50 MG PO TABS
50.0000 mg | ORAL_TABLET | Freq: Three times a day (TID) | ORAL | Status: DC | PRN
  Administered 2020-04-02 – 2020-04-09 (×7): 50 mg via ORAL
  Filled 2020-03-31 (×8): qty 1

## 2020-03-31 MED ORDER — PIPERACILLIN-TAZOBACTAM 3.375 G IVPB 30 MIN: 3.3750 g | Freq: Four times a day (QID) | INTRAVENOUS | Status: DC

## 2020-03-31 MED ORDER — VANCOMYCIN HCL 1000 MG IV SOLR: INTRAVENOUS | Status: DC | PRN

## 2020-03-31 MED ORDER — HYDROMORPHONE HCL 1 MG/ML IJ SOLN
0.2500 mg | INTRAMUSCULAR | Status: DC | PRN
  Administered 2020-03-31 (×4): 0.5 mg via INTRAVENOUS

## 2020-03-31 MED ORDER — ROCURONIUM BROMIDE 10 MG/ML (PF) SYRINGE
PREFILLED_SYRINGE | INTRAVENOUS | Status: DC | PRN
  Administered 2020-03-31: 60 mg via INTRAVENOUS

## 2020-03-31 MED ORDER — VANCOMYCIN HCL 1000 MG IV SOLR
INTRAVENOUS | Status: AC
  Filled 2020-03-31: qty 1000

## 2020-03-31 MED ORDER — CHLORHEXIDINE GLUCONATE 0.12 % MT SOLN
OROMUCOSAL | Status: AC
  Administered 2020-03-31: 15 mL via OROMUCOSAL
  Filled 2020-03-31: qty 15

## 2020-03-31 MED ORDER — HYDROMORPHONE HCL 1 MG/ML IJ SOLN
2.0000 mg | Freq: Once | INTRAMUSCULAR | Status: AC
  Administered 2020-03-31: 2 mg via INTRAVENOUS
  Filled 2020-03-31: qty 2

## 2020-03-31 MED ORDER — VANCOMYCIN HCL IN DEXTROSE 1-5 GM/200ML-% IV SOLN
INTRAVENOUS | Status: AC
  Filled 2020-03-31: qty 200

## 2020-03-31 MED ORDER — OXYCODONE-ACETAMINOPHEN 7.5-325 MG PO TABS
1.0000 | ORAL_TABLET | Freq: Four times a day (QID) | ORAL | Status: DC | PRN
  Administered 2020-03-31: 2 via ORAL

## 2020-03-31 MED ORDER — DEXAMETHASONE SODIUM PHOSPHATE 10 MG/ML IJ SOLN
INTRAMUSCULAR | Status: DC | PRN
  Administered 2020-03-31: 4 mg via INTRAVENOUS

## 2020-03-31 MED ORDER — CHLORHEXIDINE GLUCONATE 0.12 % MT SOLN
15.0000 mL | OROMUCOSAL | Status: AC
  Filled 2020-03-31: qty 15

## 2020-03-31 MED ORDER — ADULT MULTIVITAMIN W/MINERALS CH
1.0000 | ORAL_TABLET | Freq: Every day | ORAL | Status: DC
  Administered 2020-03-31 – 2020-04-14 (×12): 1 via ORAL
  Filled 2020-03-31 (×14): qty 1

## 2020-03-31 MED ORDER — MIDAZOLAM HCL 5 MG/5ML IJ SOLN
INTRAMUSCULAR | Status: DC | PRN
  Administered 2020-03-31: 2 mg via INTRAVENOUS

## 2020-03-31 MED ORDER — OXYCODONE-ACETAMINOPHEN 7.5-325 MG PO TABS
ORAL_TABLET | ORAL | Status: AC
  Filled 2020-03-31: qty 2

## 2020-03-31 MED ORDER — METOCLOPRAMIDE HCL 5 MG/ML IJ SOLN: 5.0000 mg | Freq: Three times a day (TID) | INTRAMUSCULAR | Status: DC | PRN

## 2020-03-31 MED ORDER — POVIDONE-IODINE 10 % EX SWAB: 2.0000 "application " | Freq: Once | CUTANEOUS | Status: DC

## 2020-03-31 MED ORDER — ROCURONIUM BROMIDE 10 MG/ML (PF) SYRINGE
PREFILLED_SYRINGE | INTRAVENOUS | Status: AC
  Filled 2020-03-31: qty 10

## 2020-03-31 MED ORDER — PROPOFOL 10 MG/ML IV BOLUS
INTRAVENOUS | Status: DC | PRN
  Administered 2020-03-31: 150 mg via INTRAVENOUS

## 2020-03-31 MED ORDER — ONDANSETRON 4 MG PO TBDP: 4.0000 mg | ORAL_TABLET | Freq: Three times a day (TID) | ORAL | Status: DC | PRN

## 2020-03-31 MED ORDER — MIDAZOLAM HCL 2 MG/2ML IJ SOLN
INTRAMUSCULAR | Status: AC
  Filled 2020-03-31: qty 2

## 2020-03-31 MED ORDER — VANCOMYCIN HCL 1000 MG IV SOLR
INTRAVENOUS | Status: DC | PRN
  Administered 2020-03-31: 1000 mg via INTRAVENOUS

## 2020-03-31 MED ORDER — GENTAMICIN SULFATE 40 MG/ML IJ SOLN
INTRAMUSCULAR | Status: AC
  Filled 2020-03-31: qty 2

## 2020-03-31 MED ORDER — VITAMIN D 25 MCG (1000 UNIT) PO TABS
2000.0000 [IU] | ORAL_TABLET | Freq: Two times a day (BID) | ORAL | Status: DC
  Administered 2020-03-31 – 2020-04-14 (×26): 2000 [IU] via ORAL
  Filled 2020-03-31 (×26): qty 2

## 2020-03-31 MED ORDER — HYDROMORPHONE HCL 1 MG/ML IJ SOLN
1.0000 mg | INTRAMUSCULAR | Status: DC | PRN
  Administered 2020-04-01 – 2020-04-05 (×33): 1 mg via INTRAVENOUS
  Filled 2020-03-31 (×34): qty 1

## 2020-03-31 MED ORDER — ACETAMINOPHEN 10 MG/ML IV SOLN
1000.0000 mg | Freq: Once | INTRAVENOUS | Status: DC | PRN
  Administered 2020-03-31: 1000 mg via INTRAVENOUS

## 2020-03-31 MED ORDER — HYDROCODONE-ACETAMINOPHEN 7.5-325 MG PO TABS: 1.0000 | ORAL_TABLET | Freq: Four times a day (QID) | ORAL | Status: DC | PRN

## 2020-03-31 MED ORDER — OXYCODONE HCL 5 MG PO TABS
5.0000 mg | ORAL_TABLET | ORAL | Status: DC | PRN
  Administered 2020-03-31 – 2020-04-05 (×19): 10 mg via ORAL
  Filled 2020-03-31 (×19): qty 2

## 2020-03-31 MED ORDER — ACETAMINOPHEN 500 MG PO TABS: 500.0000 mg | ORAL_TABLET | Freq: Two times a day (BID) | ORAL | Status: DC

## 2020-03-31 MED ORDER — TOBRAMYCIN SULFATE 80 MG/2ML IJ SOLN
INTRAMUSCULAR | Status: AC
  Filled 2020-03-31: qty 6

## 2020-03-31 MED ORDER — METOCLOPRAMIDE HCL 5 MG PO TABS: 5.0000 mg | ORAL_TABLET | Freq: Three times a day (TID) | ORAL | Status: DC | PRN

## 2020-03-31 MED ORDER — ASCORBIC ACID 500 MG PO TABS
500.0000 mg | ORAL_TABLET | Freq: Every day | ORAL | Status: DC
  Administered 2020-03-31 – 2020-04-05 (×6): 500 mg via ORAL
  Filled 2020-03-31 (×6): qty 1

## 2020-03-31 MED ORDER — LIDOCAINE 20MG/ML (2%) 15 ML SYRINGE OPTIME
INTRAMUSCULAR | Status: DC | PRN
  Administered 2020-03-31: 40 mg via INTRAVENOUS

## 2020-03-31 MED ORDER — AMISULPRIDE (ANTIEMETIC) 5 MG/2ML IV SOLN: 10.0000 mg | Freq: Once | INTRAVENOUS | Status: DC | PRN

## 2020-03-31 MED ORDER — DOCUSATE SODIUM 100 MG PO CAPS
100.0000 mg | ORAL_CAPSULE | Freq: Two times a day (BID) | ORAL | Status: DC
  Administered 2020-03-31 – 2020-04-13 (×6): 100 mg via ORAL
  Filled 2020-03-31 (×22): qty 1

## 2020-03-31 MED ORDER — SENNA 8.6 MG PO TABS
1.0000 | ORAL_TABLET | Freq: Two times a day (BID) | ORAL | Status: DC
  Administered 2020-04-01 – 2020-04-13 (×7): 8.6 mg via ORAL
  Filled 2020-03-31 (×19): qty 1

## 2020-03-31 MED ORDER — TOBRAMYCIN SULFATE 80 MG/2ML IJ SOLN: INTRAMUSCULAR | Status: DC | PRN

## 2020-03-31 MED ORDER — VANCOMYCIN HCL 500 MG IV SOLR
INTRAVENOUS | Status: AC
  Filled 2020-03-31: qty 500

## 2020-03-31 MED ORDER — SODIUM CHLORIDE 0.9 % IR SOLN
Status: DC | PRN
  Administered 2020-03-31 (×2): 3000 mL

## 2020-03-31 MED ORDER — ACETAMINOPHEN 325 MG PO TABS
650.0000 mg | ORAL_TABLET | Freq: Four times a day (QID) | ORAL | Status: DC
  Administered 2020-03-31 – 2020-04-04 (×17): 650 mg via ORAL
  Filled 2020-03-31 (×18): qty 2

## 2020-03-31 MED ORDER — VITAMIN D (ERGOCALCIFEROL) 1.25 MG (50000 UNIT) PO CAPS
50000.0000 [IU] | ORAL_CAPSULE | ORAL | Status: DC
  Administered 2020-04-01 – 2020-04-08 (×2): 50000 [IU] via ORAL
  Filled 2020-03-31 (×2): qty 1

## 2020-03-31 MED ORDER — METHOCARBAMOL 500 MG PO TABS
500.0000 mg | ORAL_TABLET | Freq: Three times a day (TID) | ORAL | Status: DC | PRN
  Administered 2020-03-31: 1000 mg via ORAL
  Administered 2020-03-31: 500 mg via ORAL
  Administered 2020-04-02 (×2): 1000 mg via ORAL
  Administered 2020-04-02: 500 mg via ORAL
  Administered 2020-04-03 (×2): 1000 mg via ORAL
  Administered 2020-04-04: 500 mg via ORAL
  Filled 2020-03-31: qty 1
  Filled 2020-03-31 (×2): qty 2
  Filled 2020-03-31: qty 1
  Filled 2020-03-31 (×3): qty 2

## 2020-03-31 MED ORDER — DEXAMETHASONE SODIUM PHOSPHATE 10 MG/ML IJ SOLN
INTRAMUSCULAR | Status: AC
  Filled 2020-03-31: qty 1

## 2020-03-31 MED ORDER — ONDANSETRON HCL 4 MG PO TABS: 4.0000 mg | ORAL_TABLET | Freq: Four times a day (QID) | ORAL | Status: DC | PRN

## 2020-03-31 MED ORDER — ASPIRIN EC 81 MG PO TBEC
81.0000 mg | DELAYED_RELEASE_TABLET | Freq: Every day | ORAL | Status: DC
  Administered 2020-03-31 – 2020-04-14 (×14): 81 mg via ORAL
  Filled 2020-03-31 (×14): qty 1

## 2020-03-31 MED ORDER — GABAPENTIN 300 MG PO CAPS
300.0000 mg | ORAL_CAPSULE | Freq: Three times a day (TID) | ORAL | Status: DC
  Administered 2020-03-31 – 2020-04-05 (×17): 300 mg via ORAL
  Filled 2020-03-31 (×17): qty 1

## 2020-03-31 MED ORDER — METHOCARBAMOL 500 MG PO TABS
ORAL_TABLET | ORAL | Status: AC
  Filled 2020-03-31: qty 1

## 2020-03-31 MED ORDER — 0.9 % SODIUM CHLORIDE (POUR BTL) OPTIME
TOPICAL | Status: DC | PRN
  Administered 2020-03-31: 1000 mL

## 2020-03-31 MED ORDER — MEPERIDINE HCL 25 MG/ML IJ SOLN: 6.2500 mg | INTRAMUSCULAR | Status: DC | PRN

## 2020-03-31 MED ORDER — FENTANYL CITRATE (PF) 100 MCG/2ML IJ SOLN
INTRAMUSCULAR | Status: DC | PRN
  Administered 2020-03-31: 50 ug via INTRAVENOUS
  Administered 2020-03-31: 150 ug via INTRAVENOUS
  Administered 2020-03-31 (×2): 50 ug via INTRAVENOUS

## 2020-03-31 MED ORDER — PIPERACILLIN-TAZOBACTAM 3.375 G IVPB 30 MIN
3.3750 g | Freq: Once | INTRAVENOUS | Status: AC
  Administered 2020-03-31: 3.375 g via INTRAVENOUS
  Filled 2020-03-31: qty 50

## 2020-03-31 MED ORDER — TOBRAMYCIN SULFATE 1.2 G IJ SOLR
INTRAMUSCULAR | Status: AC
  Filled 2020-03-31: qty 1.2

## 2020-03-31 MED ORDER — LIDOCAINE 2% (20 MG/ML) 5 ML SYRINGE
INTRAMUSCULAR | Status: AC
  Filled 2020-03-31: qty 5

## 2020-03-31 MED ORDER — ACETAMINOPHEN 10 MG/ML IV SOLN
INTRAVENOUS | Status: AC
  Filled 2020-03-31: qty 100

## 2020-03-31 SURGICAL SUPPLY — 54 items
BNDG COHESIVE 4X5 TAN STRL (GAUZE/BANDAGES/DRESSINGS) ×4 IMPLANT
BNDG GAUZE ELAST 4 BULKY (GAUZE/BANDAGES/DRESSINGS) ×8 IMPLANT
BRUSH SCRUB EZ PLAIN DRY (MISCELLANEOUS) ×8 IMPLANT
CANISTER WOUNDNEG PRESSURE 500 (CANNISTER) ×2 IMPLANT
CASSETTE VERAFLO VERALINK (MISCELLANEOUS) ×2 IMPLANT
COVER SURGICAL LIGHT HANDLE (MISCELLANEOUS) ×6 IMPLANT
COVER WAND RF STERILE (DRAPES) IMPLANT
DRAPE DERMATAC (DRAPES) ×2 IMPLANT
DRAPE INCISE IOBAN 66X45 STRL (DRAPES) ×4 IMPLANT
DRAPE U-SHAPE 47X51 STRL (DRAPES) ×4 IMPLANT
DRESSING VERAFLO CLEANSE CC (GAUZE/BANDAGES/DRESSINGS) IMPLANT
DRSG ADAPTIC 3X8 NADH LF (GAUZE/BANDAGES/DRESSINGS) ×2 IMPLANT
DRSG MEPILEX BORDER 4X4 (GAUZE/BANDAGES/DRESSINGS) ×2 IMPLANT
DRSG MEPITEL 4X7.2 (GAUZE/BANDAGES/DRESSINGS) ×2 IMPLANT
DRSG VERAFLO CLEANSE CC (GAUZE/BANDAGES/DRESSINGS) ×4
ELECT REM PT RETURN 9FT ADLT (ELECTROSURGICAL)
ELECTRODE REM PT RTRN 9FT ADLT (ELECTROSURGICAL) IMPLANT
GAUZE SPONGE 4X4 12PLY STRL (GAUZE/BANDAGES/DRESSINGS) ×4 IMPLANT
GLOVE BIO SURGEON STRL SZ7.5 (GLOVE) ×4 IMPLANT
GLOVE BIO SURGEON STRL SZ8 (GLOVE) ×4 IMPLANT
GLOVE BIOGEL PI IND STRL 7.5 (GLOVE) ×2 IMPLANT
GLOVE BIOGEL PI IND STRL 8 (GLOVE) ×2 IMPLANT
GLOVE BIOGEL PI IND STRL 9 (GLOVE) ×2 IMPLANT
GLOVE BIOGEL PI INDICATOR 7.5 (GLOVE) ×2
GLOVE BIOGEL PI INDICATOR 8 (GLOVE) ×2
GLOVE BIOGEL PI INDICATOR 9 (GLOVE) ×2
GOWN STRL REUS W/ TWL LRG LVL3 (GOWN DISPOSABLE) ×4 IMPLANT
GOWN STRL REUS W/ TWL XL LVL3 (GOWN DISPOSABLE) ×2 IMPLANT
GOWN STRL REUS W/TWL LRG LVL3 (GOWN DISPOSABLE) ×8
GOWN STRL REUS W/TWL XL LVL3 (GOWN DISPOSABLE) ×4
HANDPIECE INTERPULSE COAX TIP (DISPOSABLE)
KIT BASIN OR (CUSTOM PROCEDURE TRAY) ×4 IMPLANT
KIT TURNOVER KIT B (KITS) ×4 IMPLANT
MANIFOLD NEPTUNE II (INSTRUMENTS) ×4 IMPLANT
NS IRRIG 1000ML POUR BTL (IV SOLUTION) ×4 IMPLANT
PACK ORTHO EXTREMITY (CUSTOM PROCEDURE TRAY) ×4 IMPLANT
PAD ARMBOARD 7.5X6 YLW CONV (MISCELLANEOUS) ×8 IMPLANT
PADDING CAST COTTON 6X4 STRL (CAST SUPPLIES) ×2 IMPLANT
SET HNDPC FAN SPRY TIP SCT (DISPOSABLE) IMPLANT
SOL PREP POV-IOD 4OZ 10% (MISCELLANEOUS) ×4 IMPLANT
SOL PREP PROV IODINE SCRUB 4OZ (MISCELLANEOUS) ×4 IMPLANT
SPONGE LAP 18X18 RF (DISPOSABLE) ×4 IMPLANT
STOCKINETTE IMPERVIOUS 9X36 MD (GAUZE/BANDAGES/DRESSINGS) IMPLANT
SUT ETHILON 2 0 PSLX (SUTURE) ×6 IMPLANT
SUT PDS AB 2-0 CT1 27 (SUTURE) IMPLANT
SYR CONTROL 10ML LL (SYRINGE) ×2 IMPLANT
Stimulan rapid cure IMPLANT
TOWEL GREEN STERILE (TOWEL DISPOSABLE) ×8 IMPLANT
TOWEL GREEN STERILE FF (TOWEL DISPOSABLE) ×4 IMPLANT
TUBE CONNECTING 12'X1/4 (SUCTIONS) ×1
TUBE CONNECTING 12X1/4 (SUCTIONS) ×3 IMPLANT
UNDERPAD 30X36 HEAVY ABSORB (UNDERPADS AND DIAPERS) ×4 IMPLANT
WATER STERILE IRR 1000ML POUR (IV SOLUTION) ×4 IMPLANT
YANKAUER SUCT BULB TIP NO VENT (SUCTIONS) ×4 IMPLANT

## 2020-03-31 NOTE — Transfer of Care (Signed)
Immediate Anesthesia Transfer of Care Note  Patient: Michael Price  Procedure(s) Performed: IRRIGATION AND DEBRIDEMENT LEFT ARM (Left ) APPLICATION OF WOUND VAC (Left Arm Upper)  Patient Location: PACU  Anesthesia Type:General  Level of Consciousness: drowsy and patient cooperative  Airway & Oxygen Therapy: Patient Spontanous Breathing and Patient connected to face mask oxygen  Post-op Assessment: Report given to RN and Post -op Vital signs reviewed and stable  Post vital signs: Reviewed and stable  Last Vitals:  Vitals Value Taken Time  BP 116/77 03/31/20 1256  Temp    Pulse 79 03/31/20 1307  Resp 14 03/31/20 1307  SpO2 100 % 03/31/20 1307  Vitals shown include unvalidated device data.  Last Pain:  Vitals:   03/31/20 1005  TempSrc:   PainSc: 5          Complications: No complications documented.
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
## 2020-03-31 NOTE — H&P (Signed)
[facility]th[facility]edic [facility]uma [facility] H&P/Consult     Patient ID: PHU RECORD MRN: [id number] DOB/AGE: 1976-04-10 44 y.o.  Chief Complaint: Left arm traumatic wound infection s/p grade 3C open left humerus HPI: Michael Price is an 44 y.o. male.s/p grade 3C with vacular by-pass of brachial artery by Dr. Edilia Bo on 03/17/20, two I&D's and ORIF combined with osteotomy to plate the injury. He has noted 3 days of progressive drainage, redness, and foul odor. Denies fever or malaise. Mother made him come in.  Past Medical History:  Diagnosis Date  . Anxiety   . Mastoiditis   . Mastoiditis of left side     Past Surgical History:  Procedure Laterality Date  . arm surgery    . ARTERY EXPLORATION Left 03/17/2020   Procedure: REPAIR TRANSECTED BRACHIAL ARTERY WITH INTERPOSITION VEIN GRAFT FROM LEFT THIGH;  Surgeon: Chuck Hint, MD;  Location: [facility] OR;  Service: Vascular;  Laterality: Left;  . EAR CYST EXCISION    . HIP CLOSED REDUCTION Right 03/17/2020   Procedure: CLOSED REDUCTION RIGHT HIP WITH RIGHT LEG SKELETAL TRACTION PINNING;  Surgeon: Tarry Kos, MD;  Location: [facility] OR;  Service: Orthopedics;  Laterality: Right;  . I & D EXTREMITY Left 03/17/2020   Procedure: LEFT HUMERUS IRRIGATION AND DEBRIDEMENT;  Surgeon: Tarry Kos, MD;  Location: [facility] OR;  Service: Orthopedics;  Laterality: Left;  . I & D EXTREMITY Left 03/19/2020   Procedure: IRRIGATION AND DEBRIDEMENT EXTREMITY;  Surgeon: Myrene Galas, MD;  Location: [facility] OR;  Service: Orthopedics;  Laterality: Left;  Marland Kitchen MASTOIDECTOMY REVISION    . ORIF HUMERUS FRACTURE Left 03/19/2020   Procedure: OPEN REDUCTION INTERNAL FIXATION (ORIF) HUMERAL SHAFT FRACTURE;  Surgeon: Myrene Galas, MD;  Location: [facility] OR;  Service: Orthopedics;  Laterality: Left;  . othropedic      History reviewed. No pertinent family history. Social History:  reports that he has never smoked. He has never used smokeless tobacco. He reports previous  alcohol use. He reports previous drug use.  Allergies: No Known Allergies  (Not in a hospital admission)   Results for orders placed or performed during the hospital encounter of 03/30/20 (from the past 48 hour(s))  Comprehensive metabolic panel     Status: Abnormal   Collection Time: 03/30/20 10:14 PM  Result Value Ref Range   Sodium 136 135 - 145 mmol/L   Potassium 3.8 3.5 - 5.1 mmol/L   Chloride 100 98 - 111 mmol/L   CO2 26 22 - 32 mmol/L   Glucose, Bld 107 (H) 70 - 99 mg/dL    Comment: Glucose reference range applies only to samples taken after fasting for at least 8 hours.   BUN 10 6 - 20 mg/dL   Creatinine, Ser 2.67 0.61 - 1.24 mg/dL   Calcium 8.7 (L) 8.9 - 10.3 mg/dL   Total Protein 7.4 6.5 - 8.1 g/dL   Albumin 3.5 3.5 - 5.0 g/dL   AST 21 15 - 41 U/L   ALT 28 0 - 44 U/L   Alkaline Phosphatase 101 38 - 126 U/L   Total Bilirubin 0.6 0.3 - 1.2 mg/dL   GFR calc non Af Amer >60 >60 mL/min   GFR calc Af Amer >60 >60 mL/min   Anion gap 10 5 - 15    Comment: Performed at [facility], [address] [place], [place] [address]  Lactic acid, plasma     Status: None   Collection Time: 03/30/20 10:14 PM  Result Value Ref Range   Lactic Acid, Venous 1.5 0.5 - 1.9 mmol/L    Comment: Performed at [facility], [address] [place], [place] [address]  CBC with Differential     Status: Abnormal   Collection Time: 03/30/20 10:14 PM  Result Value Ref Range   WBC 10.9 (H) 4.0 - 10.5 K/uL   RBC 3.40 (L) 4.22 - 5.81 MIL/uL   Hemoglobin 9.5 (L) 13.0 - 17.0 g/dL   HCT 17.6 (L) 39 - 52 %   MCV 92.9 80.0 - 100.0 fL   MCH 27.9 26.0 - 34.0 pg   MCHC 30.1 30.0 - 36.0 g/dL   RDW 16.0 (H) 73.7 - 10.6 %   Platelets 693 (H) 150 - 400 K/uL   nRBC 0.0 0.0 - 0.2 %   Neutrophils Relative % 71 %   Neutro Abs 7.8 (H) 1.7 - 7.7 K/uL   Lymphocytes Relative 14 %   Lymphs Abs 1.6 0.7 - 4.0 K/uL   Monocytes Relative 8 %   Monocytes Absolute 0.9 0 - 1 K/uL   Eosinophils Relative 4 %    Eosinophils Absolute 0.4 0 - 0 K/uL   Basophils Relative 1 %   Basophils Absolute 0.1 0 - 0 K/uL   Immature Granulocytes 2 %   Abs Immature Granulocytes 0.16 (H) 0.00 - 0.07 K/uL    Comment: Performed at [facility], [address] [place], [place] [address]  Protime-INR     Status: None   Collection Time: 03/30/20 10:14 PM  Result Value Ref Range   Prothrombin Time 13.2 11.4 - 15.2 seconds   INR 1.0 0.8 - 1.2    Comment: (NOTE) INR goal varies based on device and disease states. Performed at [facility], [address], [place], [place] [address]   Urinalysis, Routine w reflex microscopic Urine, Clean Catch     Status: Abnormal   Collection Time: 03/30/20 10:16 PM  Result Value Ref Range   Color, Urine YELLOW YELLOW   APPearance CLOUDY (A) CLEAR   Specific Gravity, Urine 1.020 1.005 - 1.030   pH 6.0 5.0 - 8.0   Glucose, UA NEGATIVE NEGATIVE mg/dL   Hgb urine dipstick NEGATIVE NEGATIVE   Bilirubin Urine SMALL (A) NEGATIVE   Ketones, ur NEGATIVE NEGATIVE mg/dL   Protein, ur NEGATIVE NEGATIVE mg/dL   Nitrite NEGATIVE NEGATIVE   Leukocytes,Ua NEGATIVE NEGATIVE    Comment: Performed at [facility], [address], [place], [place] [address]  Lactic acid, plasma     Status: None   Collection Time: 03/31/20  7:59 AM  Result Value Ref Range   Lactic Acid, Venous 1.5 0.5 - 1.9 mmol/L    Comment: Performed at [facility], [address], [place], [place] [address]  CBC with Differential     Status: Abnormal   Collection Time: 03/31/20  7:59 AM  Result Value Ref Range   WBC 9.2 4.0 - 10.5 K/uL   RBC 3.60 (L) 4.22 - 5.81 MIL/uL   Hemoglobin 10.3 (L) 13.0 - 17.0 g/dL   HCT 29.9 (L) 39 - 52 %   MCV 93.3 80.0 - 100.0 fL   MCH 28.6 26.0 - 34.0 pg   MCHC 30.7 30.0 - 36.0 g/dL   RDW 37.1 (H) 69.6 - 78.9 %   Platelets 686 (H) 150 - 400 K/uL   nRBC 0.0 0.0 - 0.2 %   Neutrophils Relative % 68 %   Neutro  Abs 6.3 1.7 - 7.7 K/uL   Lymphocytes Relative 17 %    Lymphs Abs 1.6 0.7 - 4.0 K/uL   Monocytes Relative 9 %   Monocytes Absolute 0.8 0 - 1 K/uL   Eosinophils Relative 4 %   Eosinophils Absolute 0.3 0 - 0 K/uL   Basophils Relative 0 %   Basophils Absolute 0.0 0 - 0 K/uL   Immature Granulocytes 2 %   Abs Immature Granulocytes 0.16 (H) 0.00 - 0.07 K/uL    Comment: Performed at [facility], [address], [place], [place] [address]   DG Chest 2 View  Result Date: 03/30/2020 CLINICAL DATA:  Chest pain EXAM: CHEST - 2 VIEW COMPARISON:  Humeral radiograph 03/19/2020, chest radiograph 03/19/2019 FINDINGS: No consolidation, features of edema, pneumothorax, or effusion. Pulmonary vascularity is normally distributed. The cardiomediastinal contours are unremarkable. Widening and inferior displacement of the left humeral head, possibly positional or related to a large left shoulder effusion. Hardware noted in the proximal left humerus. IMPRESSION: 1. Basilar atelectasis without other acute cardiopulmonary disease. 2. Possible left glenohumeral widening and inferior displacement of the left humeral head, possibly positional or related to a large left shoulder effusion with evidence of prior left humeral ORIF. Consider dedicated shoulder radiograph. Electronically Signed   By: Kreg Shropshire M.D.   On: 03/30/2020 22:37    ROS Denies fever. Remainder as above  Blood pressure 109/77, pulse 87, temperature 98.3 F (36.8 C), resp. rate 15, height 5\' 11"  (1.803 m), weight 117 kg, SpO2 100 %. Physical Exam  A&O Heart RRR No audible wheezing or chest retractions with breathing LUEx   Shoulder--most of wound has healed and looks good, however the traumatic demarcated area has progressed to necrosis as anticipated and the medial open area superior to this has purulent drainage. The more distal incision on the anterior arm shows erythema as well but no drainage  Elbow, wrist, digits- no skin wounds, nontender, no instability, no blocks to motion  Sens   Ax/R/M/U intact  Mot   Ax/ R/ PIN/ M/ AIN/ U intact  Rad 2+, generalized edema   Assessment/Plan  Wound infection and further wound demarcation s/p grade 3C open left humeral shaft  Plan for I&D, antibiotic beads, with IV abx; possible wound vac and repeat debridement depending on extent  I discussed with the patient the risks and benefits of surgery, including the possibility of persistent infection, nerve injury, vessel injury, wound breakdown, arthritis, symptomatic hardware, DVT/ PE, loss of motion, malunion, nonunion, and need for further surgery among others.  We also specifically discussed the possible need to stage surgery based on the degree of infection and other intraoperative findings.  He acknowledged these risks and wished to proceed.   , MD [facility], [facility] [phone]  03/31/2020, 8:51 AM  [facility] [address] [place] [phone] (F)

## 2020-03-31 NOTE — Progress Notes (Signed)
Orthopedic Tech Progress Note Patient Details:  Michael Price 06/17/1976 [id number] Dropped off in the OR Ortho Devices Type of Ortho Device: Abduction pillow Ortho Device/Splint Interventions: Ordered   Post Interventions Patient Tolerated: Other (comment) Instructions Provided: Other (comment)   [name] 03/31/2020, 12:23 PM
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
## 2020-03-31 NOTE — Brief Op Note (Signed)
03/31/2020  1:11 PM [id number]

## 2020-03-31 NOTE — Anesthesia Procedure Notes (Addendum)
Procedure Name: Intubation Date/Time: 03/31/2020 10:54 AM Performed by: Lowella Dell, CRNA Pre-anesthesia Checklist: Patient identified, Emergency Drugs available, Suction available and Patient being monitored Patient Re-evaluated:Patient Re-evaluated prior to induction Oxygen Delivery Method: Circle System Utilized Preoxygenation: Pre-oxygenation with 100% oxygen Induction Type: IV induction Ventilation: Mask ventilation without difficulty Laryngoscope Size: Mac and 4 Grade View: Grade I Tube type: Oral Number of attempts: 1 Airway Equipment and Method: Stylet Placement Confirmation: ETT inserted through vocal cords under direct vision,  positive ETCO2 and breath sounds checked- equal and bilateral Secured at: 23 cm Tube secured with: Tape Dental Injury: Teeth and Oropharynx as per pre-operative assessment
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
## 2020-03-31 NOTE — Plan of Care (Signed)

## 2020-04-01 ENCOUNTER — Encounter (HOSPITAL_COMMUNITY): Payer: Self-pay | Admitting: Orthopedic Surgery

## 2020-04-01 LAB — BASIC METABOLIC PANEL
Anion gap: 13 (ref 5–15)
BUN: 13 mg/dL (ref 6–20)
CO2: 22 mmol/L (ref 22–32)
Calcium: 8.4 mg/dL — ABNORMAL LOW (ref 8.9–10.3)
Chloride: 105 mmol/L (ref 98–111)
Creatinine, Ser: 0.96 mg/dL (ref 0.61–1.24)
GFR calc Af Amer: >60 mL/min (ref >60)
GFR calc non Af Amer: >60 mL/min (ref >60)
Glucose, Bld: 134 mg/dL — ABNORMAL HIGH (ref 70–99)
Potassium: 3.9 mmol/L (ref 3.5–5.1)
Sodium: 140 mmol/L (ref 135–145)

## 2020-04-01 LAB — C-REACTIVE PROTEIN: CRP: 2.6 mg/dL — ABNORMAL HIGH (ref <1.0)

## 2020-04-01 LAB — SEDIMENTATION RATE: Sed Rate: 50 mm/hr — ABNORMAL HIGH (ref 0–16)

## 2020-04-01 MED ORDER — ENOXAPARIN SODIUM 40 MG/0.4ML ~~LOC~~ SOLN
40.0000 mg | Freq: Two times a day (BID) | SUBCUTANEOUS | Status: DC
  Administered 2020-04-01 – 2020-04-14 (×25): 40 mg via SUBCUTANEOUS
  Filled 2020-04-01 (×25): qty 0.4

## 2020-04-01 MED ORDER — CALCIUM CARBONATE ANTACID 500 MG PO CHEW
1.0000 | CHEWABLE_TABLET | Freq: Two times a day (BID) | ORAL | Status: DC | PRN
  Administered 2020-04-03: 400 mg via ORAL
  Filled 2020-04-01: qty 2

## 2020-04-01 MED ORDER — PANTOPRAZOLE SODIUM 40 MG PO TBEC
40.0000 mg | DELAYED_RELEASE_TABLET | Freq: Every day | ORAL | Status: DC
  Administered 2020-04-01 – 2020-04-14 (×13): 40 mg via ORAL
  Filled 2020-04-01 (×13): qty 1

## 2020-04-01 NOTE — Progress Notes (Signed)
Orthopaedic Trauma Progress Note  S: Doing okay this morning, pain throughout left arm and hand.  Rates pain in 7/10 currently.  Adjusted pain medications yesterday evening which has seemed to help some.  Denies any significant numbness or tingling through the extremity.  Tolerating fluids and diet. Wound cultures obtained intraoperatively still pending.  O:  Vitals:   03/31/20 2355 04/01/20 0530  BP: 113/89 (!) 131/92  Pulse: (!) 110 (!) 102  Resp: 18 18  Temp: 99.1 F (37.3 C) 98.2 F (36.8 C)  SpO2: 100% 97%    General: Sitting up in bed, no acute distress.  Slightly anxious. Respiratory: No increased work of breathing. Left Upper Extremity: Dressing in place is clean, dry, intact.  Wound vac functioning well, ~460 mL output currently.  Endorses sensation to light touch throughout extremity.  Able to wiggle fingers.  Extremity warm.+ Radial pulse  Imaging: Stable left humerus imaging.   Labs:  Results for orders placed or performed during the hospital encounter of 03/30/20 (from the past 24 hour(s))  Aerobic/Anaerobic Culture (surgical/deep wound)     Status: None (Preliminary result)   Collection Time: 03/31/20 11:33 AM   Specimen: PATH Other; Tissue  Result Value Ref Range   Specimen Description WOUND LEFT ARM    Special Requests NONE    Gram Stain      MODERATE WBC PRESENT,BOTH PMN AND MONONUCLEAR FEW GRAM POSITIVE COCCI Performed at [facility], [address] [place], [place] [address]    Culture PENDING    Report Status PENDING     Assessment: 44 year old male with left arm traumatic wound infection s/p grade 3C open left humerus, 1 Day Post-Op   Procedures: 1.  Incision and drainage of left arm abscess with sharp excision of skin, subcutaneous tissue and muscle. 2.  Application of installation wound VAC.  Weightbearing: NWB LUE  Insicional and dressing care: Maintain dressing/ wound vac until follow-up  Orthopedic device(s): Wound Vac:LUE   CV/Blood  loss: Hgb 10.2 on admission, will plan to recheck tomorrow.   Pain management:  1. Tylenol 650 mg q 6 hours scheduled 2. Robaxin [id number] mg q 8 hours PRN 3. Oxycodone 5-10 mg q 4 hours PRN 4. Tramadol 50 mg q 6 hours PRN for BREAKTHROUGH PAIN ONLY 5. Dilaudid 1 mg q 2 hours PRN 6. Neurontin 300 mg TID  VTE prophylaxis: Lovenox  SCDs: Ordered, not currently in place  ID: [email or web address]  Foley/Lines: No foley, KVO IVFs  Medical co-morbidities: Anxiety, indigestion  Impediments to Fracture Healing: open fracture, Infection  Dispo: Awaiting wound culture results. Plan for return to OR on Monday for repeat I&D and wound vac change. Dr. Carola Frost will contact plastic surgery colleagues for further discussion and evaluation  Follow - up plan: TBD   [facility] [phone] (office) [email or web address]

## 2020-04-01 NOTE — Anesthesia Postprocedure Evaluation (Signed)
Anesthesia Post Note  Patient: Michael Price  Procedure(s) Performed: IRRIGATION AND DEBRIDEMENT LEFT ARM (Left ) APPLICATION OF WOUND VAC (Left Arm Upper)     Patient location during evaluation: PACU Anesthesia Type: General Level of consciousness: awake and alert Pain management: pain level controlled Vital Signs Assessment: post-procedure vital signs reviewed and stable Respiratory status: spontaneous breathing, nonlabored ventilation, respiratory function stable and patient connected to nasal cannula oxygen Cardiovascular status: blood pressure returned to baseline and stable Postop Assessment: no apparent nausea or vomiting Anesthetic complications: no   No complications documented.  Last Vitals:  Vitals:   03/31/20 2355 04/01/20 0530  BP: 113/89 (!) 131/92  Pulse: (!) 110 (!) 102  Resp: 18 18  Temp: 37.3 C 36.8 C  SpO2: 100% 97%    Last Pain:  Vitals:   04/01/20 0530  TempSrc: Oral  PainSc:                  Shelton Silvas
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
## 2020-04-02 ENCOUNTER — Inpatient Hospital Stay: Payer: Self-pay

## 2020-04-02 DIAGNOSIS — T84611A Infection and inflammatory reaction due to internal fixation device of left humerus, initial encounter: Secondary | ICD-10-CM

## 2020-04-02 DIAGNOSIS — B9562 Methicillin resistant Staphylococcus aureus infection as the cause of diseases classified elsewhere: Secondary | ICD-10-CM

## 2020-04-02 DIAGNOSIS — Z8781 Personal history of (healed) traumatic fracture: Secondary | ICD-10-CM

## 2020-04-02 DIAGNOSIS — Z9889 Other specified postprocedural states: Secondary | ICD-10-CM

## 2020-04-02 DIAGNOSIS — S72001A Fracture of unspecified part of neck of right femur, initial encounter for closed fracture: Secondary | ICD-10-CM | POA: Diagnosis present

## 2020-04-02 DIAGNOSIS — F419 Anxiety disorder, unspecified: Secondary | ICD-10-CM

## 2020-04-02 DIAGNOSIS — T847XXA Infection and inflammatory reaction due to other internal orthopedic prosthetic devices, implants and grafts, initial encounter: Secondary | ICD-10-CM | POA: Diagnosis present

## 2020-04-02 DIAGNOSIS — M86122 Other acute osteomyelitis, left humerus: Secondary | ICD-10-CM

## 2020-04-02 LAB — CBC
HCT: 26.4 % — ABNORMAL LOW (ref 39.0–52.0)
Hemoglobin: 8 g/dL — ABNORMAL LOW (ref 13.0–17.0)
MCH: 27.7 pg (ref 26.0–34.0)
MCHC: 30.3 g/dL (ref 30.0–36.0)
MCV: 91.3 fL (ref 80.0–100.0)
Platelets: 618 10*3/uL — ABNORMAL HIGH (ref 150–400)
RBC: 2.89 MIL/uL — ABNORMAL LOW (ref 4.22–5.81)
RDW: 16.2 % — ABNORMAL HIGH (ref 11.5–15.5)
WBC: 7.6 10*3/uL (ref 4.0–10.5)
nRBC: 0 % (ref 0.0–0.2)

## 2020-04-02 LAB — AEROBIC CULTURE W GRAM STAIN (SUPERFICIAL SPECIMEN): Special Requests: NORMAL

## 2020-04-02 MED ORDER — VANCOMYCIN HCL 10 G IV SOLR
2500.0000 mg | Freq: Once | INTRAVENOUS | Status: AC
  Administered 2020-04-02: 2500 mg via INTRAVENOUS
  Filled 2020-04-02: qty 2500

## 2020-04-02 MED ORDER — VANCOMYCIN HCL IN DEXTROSE 1-5 GM/200ML-% IV SOLN
1000.0000 mg | Freq: Three times a day (TID) | INTRAVENOUS | Status: DC
  Administered 2020-04-03 – 2020-04-11 (×28): 1000 mg via INTRAVENOUS
  Filled 2020-04-02 (×34): qty 200

## 2020-04-02 MED ORDER — RIFAMPIN 300 MG PO CAPS
600.0000 mg | ORAL_CAPSULE | Freq: Every day | ORAL | Status: DC
  Administered 2020-04-02 – 2020-04-14 (×11): 600 mg via ORAL
  Filled 2020-04-02 (×13): qty 2

## 2020-04-02 NOTE — Plan of Care (Signed)
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
## 2020-04-02 NOTE — Plan of Care (Signed)
  Problem: Education: Goal: Knowledge of General Education information will improve Description: Including pain rating scale, medication(s)/side effects and non-pharmacologic comfort measures Outcome: Progressing   Problem: Clinical Measurements: Goal: Will remain free from infection Outcome: Progressing   Problem: Pain Managment: Goal: General experience of comfort will improve Outcome: Not Progressing   

## 2020-04-02 NOTE — Progress Notes (Signed)
   ORTHOPAEDIC PROGRESS NOTE  s/p Procedure(s): IRRIGATION AND DEBRIDEMENT LEFT ARM APPLICATION OF WOUND VAC  SUBJECTIVE: Reports mild pain about operative site. No chest pain. No SOB. No nausea/vomiting. No other complaints.  OBJECTIVE: PE:  Vitals:   04/02/20 0438 04/02/20 0819  BP: 114/78 118/75  Pulse: 90 97  Resp: 17 17  Temp: 98.5 F (36.9 C) 98.5 F (36.9 C)  SpO2: 99% 99%     ASSESSMENT: Michael Price is a 44 y.o. male doing well postoperatively.  PLAN: Weightbearing: NWB LUE Insicional and dressing care: Dressings left intact until follow-up Orthopedic device(s): None and Wound ZOX:WRUEAV and functioning Showering: no VTE prophylaxis: Lovenox 40mg  qd 30 days Pain control: dilaudid IV and oxycodone Follow - up plan: to OR on Monday Contact information:    Patient ID: Wednesday, male   DOB: 11-22-1975, 44 y.o.   MRN: [id number]

## 2020-04-02 NOTE — Progress Notes (Signed)
Spoke with RN re PICC order.  Aware that PICC will not be placed today.  Will call 04/03/20 re placement.  RN to notify pt.

## 2020-04-02 NOTE — Plan of Care (Signed)
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
## 2020-04-03 LAB — CBC
HCT: 29.6 % — ABNORMAL LOW (ref 39.0–52.0)
Hemoglobin: 9 g/dL — ABNORMAL LOW (ref 13.0–17.0)
MCH: 27.9 pg (ref 26.0–34.0)
MCHC: 30.4 g/dL (ref 30.0–36.0)
MCV: 91.6 fL (ref 80.0–100.0)
Platelets: 637 10*3/uL — ABNORMAL HIGH (ref 150–400)
RBC: 3.23 MIL/uL — ABNORMAL LOW (ref 4.22–5.81)
RDW: 16.1 % — ABNORMAL HIGH (ref 11.5–15.5)
WBC: 6 10*3/uL (ref 4.0–10.5)
nRBC: 0 % (ref 0.0–0.2)

## 2020-04-03 LAB — BASIC METABOLIC PANEL
Anion gap: 9 (ref 5–15)
BUN: 10 mg/dL (ref 6–20)
CO2: 26 mmol/L (ref 22–32)
Calcium: 8.7 mg/dL — ABNORMAL LOW (ref 8.9–10.3)
Chloride: 103 mmol/L (ref 98–111)
Creatinine, Ser: 0.9 mg/dL (ref 0.61–1.24)
GFR calc Af Amer: >60 mL/min (ref >60)
GFR calc non Af Amer: >60 mL/min (ref >60)
Glucose, Bld: 124 mg/dL — ABNORMAL HIGH (ref 70–99)
Potassium: 3.7 mmol/L (ref 3.5–5.1)
Sodium: 138 mmol/L (ref 135–145)

## 2020-04-03 MED ORDER — SODIUM CHLORIDE 0.9% FLUSH
10.0000 mL | INTRAVENOUS | Status: DC | PRN
  Administered 2020-04-12: 10 mL

## 2020-04-03 MED ORDER — CHLORHEXIDINE GLUCONATE CLOTH 2 % EX PADS
6.0000 | MEDICATED_PAD | Freq: Every day | CUTANEOUS | Status: DC
  Administered 2020-04-03 – 2020-04-14 (×10): 6 via TOPICAL

## 2020-04-03 NOTE — Progress Notes (Signed)
PHARMACY CONSULT NOTE FOR:  OUTPATIENT  PARENTERAL ANTIBIOTIC THERAPY (OPAT)  Indication: wound with hardware infection Regimen: Vancomycin 1000mg  every 8 hours End date: 05/12/20  IV antibiotic discharge orders are pended. To discharging provider:  please sign these orders via discharge navigator,  Select New Orders & click on the button choice - Manage This Unsigned Work.     Thank you for allowing pharmacy to be a part of this patient's care.  05/14/20, PharmD PGY2 ID Pharmacy Resident Phone between 7 am - 3:30 pm: Margarite Gouge  Please check AMION for all [facility] Pharmacy phone numbers After 10:00 PM, call Main Pharmacy [phone]  04/03/2020, 2:05 PM

## 2020-04-03 NOTE — Plan of Care (Signed)

## 2020-04-03 NOTE — Progress Notes (Signed)
Peripherally Inserted Central Catheter Placement  The IV Nurse has discussed with the patient and/or persons authorized to consent for the patient, the purpose of this procedure and the potential benefits and risks involved with this procedure.  The benefits include less needle sticks, lab draws from the catheter, and the patient may be discharged home with the catheter. Risks include, but not limited to, infection, bleeding, blood clot (thrombus formation), and puncture of an artery; nerve damage and irregular heartbeat and possibility to perform a PICC exchange if needed/ordered by physician.  Alternatives to this procedure were also discussed.  Bard Power PICC patient education guide, fact sheet on infection prevention and patient information card has been provided to patient /or left at bedside.    PICC Placement Documentation  PICC Single Lumen 04/03/20 PICC Right Brachial 39 cm 1 cm (Active)  Indication for Insertion or Continuance of Line Home intravenous therapies (PICC only) 04/03/20 0916  Exposed Catheter (cm) 1 cm 04/03/20 0916  Site Assessment Clean;Dry;Intact 04/03/20 0916  Line Status Flushed;Saline locked;Blood return noted 04/03/20 0916  Dressing Type Transparent 04/03/20 0916  Dressing Status Clean;Dry;Intact;Antimicrobial disc in place 04/03/20 0916  Safety Lock Not Applicable 04/03/20 0916  Line Care Connections checked and tightened;Tubing changed 04/03/20 0916  Line Adjustment (NICU/IV Team Only) No 04/03/20 0916  Dressing Intervention New dressing 04/03/20 0916  Dressing Change Due 04/10/20 04/03/20 0916       Elliot Dally 04/03/2020, 9:17 AM
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
## 2020-04-03 NOTE — Progress Notes (Signed)
Subjective: 3 Days Post-Op Procedure(s) (LRB): IRRIGATION AND DEBRIDEMENT LEFT ARM (Left) APPLICATION OF WOUND VAC (Left) Patient reports pain as severe still requiring IV pain meds   More complains of hand pain due to distal swelling then pain at surgical site.    Objective: Vital signs in last 24 hours: Temp:  [98.2 F (36.8 C)-98.6 F (37 C)] 98.6 F (37 C) (08/08 0447) Pulse Rate:  [88-97] 88 (08/08 0447) Resp:  [16-18] 16 (08/08 0447) BP: (115-125)/(69-89) 125/89 (08/08 0447) SpO2:  [98 %-100 %] 100 % (08/08 0447)  Intake/Output from previous day: 08/07 0701 - 08/08 0700 In: 903.9 [P.O.:360; IV Piggyback:543.9] Out: 3080 [Urine:2980; Drains:100] Intake/Output this shift: Total I/O In: -  Out: 400 [Urine:400]  Recent Labs    03/31/20 0759 04/02/20 0153 04/03/20 0222  HGB 10.3* 8.0* 9.0*   Recent Labs    04/02/20 0153 04/03/20 0222  WBC 7.6 6.0  RBC 2.89* 3.23*  HCT 26.4* 29.6*  PLT 618* 637*   Recent Labs    04/01/20 0756  NA 140  K 3.9  CL 105  CO2 22  BUN 13  CREATININE 0.96  GLUCOSE 134*  CALCIUM 8.4*   No results for input(s): LABPT, INR in the last 72 hours.  Intact pulses distally Right arm has significant distal swelling and decreased range of motion of his hand  Assessment/Plan: 3 Days Post-Op Procedure(s) (LRB): IRRIGATION AND DEBRIDEMENT LEFT ARM (Left) APPLICATION OF WOUND VAC (Left) I removed and rewrapped ACE on forearm and hand   I worked on active and passive range of motion of his hand.   I discussed with him going back to the OR in the morning and eventually home on IV Vancomycin.[place] Artist Rafe Mackowski 04/03/2020, 7:36 AM
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
## 2020-04-04 ENCOUNTER — Inpatient Hospital Stay (HOSPITAL_COMMUNITY): Payer: Self-pay | Admitting: Anesthesiology

## 2020-04-04 ENCOUNTER — Encounter (HOSPITAL_COMMUNITY): Payer: Self-pay | Admitting: Orthopedic Surgery

## 2020-04-04 ENCOUNTER — Encounter (HOSPITAL_COMMUNITY)
Admission: EM | Disposition: A | Discharge status: Home Health | Payer: Self-pay | Source: Home / Self Care | Attending: Orthopedic Surgery

## 2020-04-04 HISTORY — PX: I & D EXTREMITY: SHX5045

## 2020-04-04 LAB — CULTURE, BLOOD (ROUTINE X 2)
Culture: NO GROWTH
Culture: NO GROWTH
Special Requests: ADEQUATE
Special Requests: ADEQUATE

## 2020-04-04 LAB — CBC
HCT: 32.3 % — ABNORMAL LOW (ref 39.0–52.0)
Hemoglobin: 9.6 g/dL — ABNORMAL LOW (ref 13.0–17.0)
MCH: 27.7 pg (ref 26.0–34.0)
MCHC: 29.7 g/dL — ABNORMAL LOW (ref 30.0–36.0)
MCV: 93.1 fL (ref 80.0–100.0)
Platelets: 663 10*3/uL — ABNORMAL HIGH (ref 150–400)
RBC: 3.47 MIL/uL — ABNORMAL LOW (ref 4.22–5.81)
RDW: 16.3 % — ABNORMAL HIGH (ref 11.5–15.5)
WBC: 6.5 10*3/uL (ref 4.0–10.5)
nRBC: 0 % (ref 0.0–0.2)

## 2020-04-04 SURGERY — IRRIGATION AND DEBRIDEMENT EXTREMITY
Anesthesia: General | Site: Arm Upper | Laterality: Left

## 2020-04-04 MED ORDER — FENTANYL CITRATE (PF) 250 MCG/5ML IJ SOLN
INTRAMUSCULAR | Status: AC
  Filled 2020-04-04: qty 5

## 2020-04-04 MED ORDER — LIDOCAINE 2% (20 MG/ML) 5 ML SYRINGE
INTRAMUSCULAR | Status: DC | PRN
  Administered 2020-04-04: 80 mg via INTRAVENOUS

## 2020-04-04 MED ORDER — MIDAZOLAM HCL 2 MG/2ML IJ SOLN
INTRAMUSCULAR | Status: AC
  Filled 2020-04-04: qty 2

## 2020-04-04 MED ORDER — CHLORHEXIDINE GLUCONATE 0.12 % MT SOLN
OROMUCOSAL | Status: AC
  Administered 2020-04-04: 15 mL via OROMUCOSAL
  Filled 2020-04-04: qty 15

## 2020-04-04 MED ORDER — PROPOFOL 10 MG/ML IV BOLUS
INTRAVENOUS | Status: DC | PRN
  Administered 2020-04-04: 200 mg via INTRAVENOUS

## 2020-04-04 MED ORDER — ONDANSETRON HCL 4 MG/2ML IJ SOLN
INTRAMUSCULAR | Status: DC | PRN
  Administered 2020-04-04: 4 mg via INTRAVENOUS

## 2020-04-04 MED ORDER — HYDROMORPHONE HCL 1 MG/ML IJ SOLN
INTRAMUSCULAR | Status: AC
  Administered 2020-04-04: 0.5 mg via INTRAVENOUS
  Filled 2020-04-04: qty 1

## 2020-04-04 MED ORDER — DEXMEDETOMIDINE (PRECEDEX) IN NS 20 MCG/5ML (4 MCG/ML) IV SYRINGE
PREFILLED_SYRINGE | INTRAVENOUS | Status: DC | PRN
  Administered 2020-04-04: 12 ug via INTRAVENOUS

## 2020-04-04 MED ORDER — MEPERIDINE HCL 25 MG/ML IJ SOLN: 6.2500 mg | INTRAMUSCULAR | Status: DC | PRN

## 2020-04-04 MED ORDER — ORAL CARE MOUTH RINSE: 15.0000 mL | Freq: Once | OROMUCOSAL | Status: AC

## 2020-04-04 MED ORDER — 0.9 % SODIUM CHLORIDE (POUR BTL) OPTIME
TOPICAL | Status: DC | PRN
  Administered 2020-04-04: 1000 mL

## 2020-04-04 MED ORDER — PROPOFOL 10 MG/ML IV BOLUS
INTRAVENOUS | Status: AC
  Filled 2020-04-04: qty 20

## 2020-04-04 MED ORDER — PHENYLEPHRINE 40 MCG/ML (10ML) SYRINGE FOR IV PUSH (FOR BLOOD PRESSURE SUPPORT)
PREFILLED_SYRINGE | INTRAVENOUS | Status: AC
  Filled 2020-04-04: qty 10

## 2020-04-04 MED ORDER — PHENYLEPHRINE 40 MCG/ML (10ML) SYRINGE FOR IV PUSH (FOR BLOOD PRESSURE SUPPORT)
PREFILLED_SYRINGE | INTRAVENOUS | Status: DC | PRN
  Administered 2020-04-04: 80 ug via INTRAVENOUS
  Administered 2020-04-04: 120 ug via INTRAVENOUS
  Administered 2020-04-04: 80 ug via INTRAVENOUS

## 2020-04-04 MED ORDER — DEXAMETHASONE SODIUM PHOSPHATE 10 MG/ML IJ SOLN
INTRAMUSCULAR | Status: AC
  Filled 2020-04-04: qty 1

## 2020-04-04 MED ORDER — LIDOCAINE 2% (20 MG/ML) 5 ML SYRINGE
INTRAMUSCULAR | Status: AC
  Filled 2020-04-04: qty 5

## 2020-04-04 MED ORDER — PROMETHAZINE HCL 25 MG/ML IJ SOLN: 6.2500 mg | INTRAMUSCULAR | Status: DC | PRN

## 2020-04-04 MED ORDER — FENTANYL CITRATE (PF) 250 MCG/5ML IJ SOLN
INTRAMUSCULAR | Status: DC | PRN
  Administered 2020-04-04 (×2): 50 ug via INTRAVENOUS
  Administered 2020-04-04: 150 ug via INTRAVENOUS

## 2020-04-04 MED ORDER — HYDROMORPHONE HCL 1 MG/ML IJ SOLN
0.5000 mg | INTRAMUSCULAR | Status: AC
  Filled 2020-04-04: qty 0.5

## 2020-04-04 MED ORDER — KETOROLAC TROMETHAMINE 30 MG/ML IJ SOLN: 30.0000 mg | Freq: Once | INTRAMUSCULAR | Status: DC | PRN

## 2020-04-04 MED ORDER — GLYCOPYRROLATE PF 0.2 MG/ML IJ SOSY
PREFILLED_SYRINGE | INTRAMUSCULAR | Status: AC
  Filled 2020-04-04: qty 1

## 2020-04-04 MED ORDER — HYDROMORPHONE HCL 1 MG/ML IJ SOLN
INTRAMUSCULAR | Status: DC | PRN
  Administered 2020-04-04: 1 mg via INTRAVENOUS

## 2020-04-04 MED ORDER — MIDAZOLAM HCL 2 MG/2ML IJ SOLN
INTRAMUSCULAR | Status: DC | PRN
  Administered 2020-04-04: 2 mg via INTRAVENOUS

## 2020-04-04 MED ORDER — CHLORHEXIDINE GLUCONATE 0.12 % MT SOLN: 15.0000 mL | Freq: Once | OROMUCOSAL | Status: AC

## 2020-04-04 MED ORDER — ONDANSETRON HCL 4 MG/2ML IJ SOLN
INTRAMUSCULAR | Status: AC
  Filled 2020-04-04: qty 2

## 2020-04-04 MED ORDER — GLYCOPYRROLATE PF 0.2 MG/ML IJ SOSY
PREFILLED_SYRINGE | INTRAMUSCULAR | Status: DC | PRN
Start: 2020-04-04 — End: 2020-04-04
  Administered 2020-04-04: .2 mg via INTRAVENOUS

## 2020-04-04 MED ORDER — LEVOFLOXACIN 500 MG PO TABS
750.0000 mg | ORAL_TABLET | Freq: Every day | ORAL | Status: DC
  Administered 2020-04-04 – 2020-04-14 (×11): 750 mg via ORAL
  Filled 2020-04-04 (×11): qty 2

## 2020-04-04 MED ORDER — SODIUM CHLORIDE 0.9 % IR SOLN
Status: DC | PRN
  Administered 2020-04-04: 3000 mL

## 2020-04-04 MED ORDER — ACETAMINOPHEN 500 MG PO TABS: 1000.0000 mg | ORAL_TABLET | Freq: Once | ORAL | Status: DC

## 2020-04-04 MED ORDER — KETAMINE HCL 10 MG/ML IJ SOLN
INTRAMUSCULAR | Status: DC | PRN
Start: 2020-04-04 — End: 2020-04-04
  Administered 2020-04-04 (×2): 10 mg via INTRAVENOUS
  Administered 2020-04-04: 30 mg via INTRAVENOUS

## 2020-04-04 MED ORDER — HYDROMORPHONE HCL 1 MG/ML IJ SOLN
INTRAMUSCULAR | Status: AC
  Filled 2020-04-04: qty 1

## 2020-04-04 MED ORDER — OXYCODONE HCL 5 MG/5ML PO SOLN: 5.0000 mg | Freq: Once | ORAL | Status: DC | PRN

## 2020-04-04 MED ORDER — LACTATED RINGERS IV SOLN: INTRAVENOUS | Status: DC

## 2020-04-04 MED ORDER — OXYCODONE HCL 5 MG PO TABS: 5.0000 mg | ORAL_TABLET | Freq: Once | ORAL | Status: DC | PRN

## 2020-04-04 MED ORDER — DEXAMETHASONE SODIUM PHOSPHATE 10 MG/ML IJ SOLN
INTRAMUSCULAR | Status: DC | PRN
  Administered 2020-04-04: 5 mg via INTRAVENOUS

## 2020-04-04 MED ORDER — HYDROMORPHONE HCL 1 MG/ML IJ SOLN: 0.2500 mg | INTRAMUSCULAR | Status: DC | PRN

## 2020-04-04 SURGICAL SUPPLY — 44 items
BNDG ADH 5X4 AIR PERM ELC (GAUZE/BANDAGES/DRESSINGS) ×1
BNDG COHESIVE 4X5 TAN STRL (GAUZE/BANDAGES/DRESSINGS) ×2 IMPLANT
BNDG COHESIVE 4X5 WHT NS (GAUZE/BANDAGES/DRESSINGS) ×2 IMPLANT
BNDG ELASTIC 3X5.8 VLCR STR LF (GAUZE/BANDAGES/DRESSINGS) ×2 IMPLANT
BRUSH SCRUB EZ PLAIN DRY (MISCELLANEOUS) ×4 IMPLANT
COVER SURGICAL LIGHT HANDLE (MISCELLANEOUS) ×6 IMPLANT
DRAPE U-SHAPE 47X51 STRL (DRAPES) ×5 IMPLANT
DRSG MEPILEX BORDER 4X4 (GAUZE/BANDAGES/DRESSINGS) ×2 IMPLANT
DRSG MEPILEX BORDER 4X8 (GAUZE/BANDAGES/DRESSINGS) ×2 IMPLANT
DRSG VAC ATS MED SENSATRAC (GAUZE/BANDAGES/DRESSINGS) ×2 IMPLANT
ELECT REM PT RETURN 9FT ADLT (ELECTROSURGICAL)
ELECTRODE REM PT RTRN 9FT ADLT (ELECTROSURGICAL) IMPLANT
GLOVE BIO SURGEON STRL SZ7.5 (GLOVE) ×7 IMPLANT
GLOVE BIO SURGEON STRL SZ8 (GLOVE) ×7 IMPLANT
GLOVE BIOGEL PI IND STRL 7.5 (GLOVE) ×1 IMPLANT
GLOVE BIOGEL PI IND STRL 8 (GLOVE) ×1 IMPLANT
GLOVE BIOGEL PI IND STRL 9 (GLOVE) ×1 IMPLANT
GLOVE BIOGEL PI INDICATOR 7.5 (GLOVE) ×4
GLOVE BIOGEL PI INDICATOR 8 (GLOVE) ×4
GLOVE BIOGEL PI INDICATOR 9 (GLOVE) ×2
GOWN STRL REUS W/ TWL LRG LVL3 (GOWN DISPOSABLE) ×2 IMPLANT
GOWN STRL REUS W/ TWL XL LVL3 (GOWN DISPOSABLE) ×1 IMPLANT
GOWN STRL REUS W/TWL LRG LVL3 (GOWN DISPOSABLE) ×6
GOWN STRL REUS W/TWL XL LVL3 (GOWN DISPOSABLE) ×6
KIT BASIN OR (CUSTOM PROCEDURE TRAY) ×3 IMPLANT
KIT TURNOVER KIT B (KITS) ×3 IMPLANT
MANIFOLD NEPTUNE II (INSTRUMENTS) ×3 IMPLANT
MICROMATRIX 1000MG (Tissue) ×6 IMPLANT
NS IRRIG 1000ML POUR BTL (IV SOLUTION) ×3 IMPLANT
PACK ORTHO EXTREMITY (CUSTOM PROCEDURE TRAY) ×3 IMPLANT
PAD ARMBOARD 7.5X6 YLW CONV (MISCELLANEOUS) ×6 IMPLANT
SOL PREP POV-IOD 4OZ 10% (MISCELLANEOUS) ×3 IMPLANT
SOL PREP PROV IODINE SCRUB 4OZ (MISCELLANEOUS) ×3 IMPLANT
SOLUTION PARTIC MCRMTRX 1000MG (Tissue) IMPLANT
SPONGE LAP 18X18 RF (DISPOSABLE) ×1 IMPLANT
STOCKINETTE IMPERVIOUS 9X36 MD (GAUZE/BANDAGES/DRESSINGS) ×2 IMPLANT
SUT PDS AB 2-0 CT1 27 (SUTURE) ×2 IMPLANT
TOWEL GREEN STERILE (TOWEL DISPOSABLE) ×6 IMPLANT
TOWEL GREEN STERILE FF (TOWEL DISPOSABLE) ×3 IMPLANT
TUBE CONNECTING 12'X1/4 (SUCTIONS) ×1
TUBE CONNECTING 12X1/4 (SUCTIONS) ×2 IMPLANT
UNDERPAD 30X36 HEAVY ABSORB (UNDERPADS AND DIAPERS) ×3 IMPLANT
WATER STERILE IRR 1000ML POUR (IV SOLUTION) ×3 IMPLANT
YANKAUER SUCT BULB TIP NO VENT (SUCTIONS) ×3 IMPLANT
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
## 2020-04-04 NOTE — Anesthesia Postprocedure Evaluation (Signed)
Anesthesia Post Note  Patient: Michael Price  Procedure(s) Performed: IRRIGATION AND DEBRIDEMENT LEFT UPPER ARM (Left Arm Upper)     Patient location during evaluation: PACU Anesthesia Type: General Level of consciousness: awake and alert, oriented and patient cooperative Pain management: pain level controlled Vital Signs Assessment: post-procedure vital signs reviewed and stable Respiratory status: spontaneous breathing, nonlabored ventilation and respiratory function stable Cardiovascular status: blood pressure returned to baseline and stable Postop Assessment: no apparent nausea or vomiting Anesthetic complications: no   No complications documented.  Last Vitals:  Vitals:   04/04/20 1005 04/04/20 1020  BP: 124/80 131/68  Pulse: 89 90  Resp: 20 14  Temp:  36.5 C  SpO2: 100% 98%    Last Pain:  Vitals:   04/04/20 1020  TempSrc:   PainSc: 6                  Lannie Fields

## 2020-04-04 NOTE — Anesthesia Procedure Notes (Signed)
Procedure Name: LMA Insertion Date/Time: 04/04/2020 8:14 AM Performed by: Mayer Camel, CRNA Pre-anesthesia Checklist: Patient identified, Emergency Drugs available, Suction available and Patient being monitored Patient Re-evaluated:Patient Re-evaluated prior to induction Oxygen Delivery Method: Circle System Utilized Preoxygenation: Pre-oxygenation with 100% oxygen Induction Type: IV induction Ventilation: Mask ventilation without difficulty LMA: LMA inserted LMA Size: 5.0 Number of attempts: 1 Airway Equipment and Method: Bite block Placement Confirmation: positive ETCO2 Tube secured with: Tape Dental Injury: Teeth and Oropharynx as per pre-operative assessment

## 2020-04-04 NOTE — Brief Op Note (Signed)
04/04/2020  10:06 AM  PATIENT:  Michael Price  44 y.o. male  PRE-OPERATIVE DIAGNOSIS:  INFECTED GRADE 3C OPEN LEFT HUMERUS FRACTURE  POST-OPERATIVE DIAGNOSIS:  INFECTED GRADE 3C OPEN LEFT HUMERUS FRACTURE  PROCEDURE:  Procedure(s): 1. IRRIGATION AND DEBRIDEMENT LEFT UPPER ARM (Left) WITH EXCISION OF MUSCLE, SKIN, SUBCUTANEOUS TISSUE 2. APPLICATION OF BIOLOGIC GRAFT, ACELL 2 gms 3. WOUND VAC DRESSING CHANGE UNDER ANESTHESIA  SURGEON:  Surgeon(s) and Role:    * Myrene Galas, MD - Primary  PHYSICIAN ASSISTANT: KEITH PAUL,PA-C  ANESTHESIA:   general  EBL:  50 mL   BLOOD ADMINISTERED:none  DRAINS: none   LOCAL MEDICATIONS USED:  NONE  SPECIMEN:  No Specimen  DISPOSITION OF SPECIMEN:  N/A  COUNTS:  YES  TOURNIQUET:  * No tourniquets in log *  DICTATION: .Other Dictation: Dictation Number [id number]  PLAN OF CARE: Admit to inpatient   PATIENT DISPOSITION:  PACU - hemodynamically stable.   Delay start of Pharmacological VTE agent (>24hrs) due to surgical blood loss or risk of bleeding: no

## 2020-04-04 NOTE — Progress Notes (Signed)
Greatly appreciate expertise of Dr. Orvan Falconer!  I discussed with the patient the risks and benefits of surgery for his left arm infection, including the possibility of persistent infection, nerve injury, vessel injury, wound breakdown, arthritis, symptomatic hardware, DVT/ PE, loss of motion, malunion, nonunion, and need for further surgery among others.  We also specifically discussed the need to stage surgery because of the deep infection and tissue loss.  He acknowledged these risks and wished to proceed.  Myrene Galas, MD [facility], [facility] [phone]

## 2020-04-04 NOTE — Progress Notes (Signed)
Patient ID: Michael Price, male   DOB: 01-11-1976, 44 y.o.   MRN: [id number]          [facility] for Infectious Disease    Date of Admission:  03/30/2020   Total days of antibiotics 5                 Today's operative indicated that Dr. Carola Frost found "isolated purulence or necrosis of deltoid tendon extending down anteriorly in the middle of the wound also some distal subcutaneous fat necrosis along the inferior wound edge. We did note a smooth type texture or feeling to the soft tissues consistent with a glycocalyx formation."  Although MRSA is the only organism isolated from his recent cultures, gram-negative rods were seen on Gram stain of his initial wound culture.  I will add levofloxacin to his IV vancomycin and oral rifampin.  As indicated in my note yesterday I plan on treating him a minimum of 6 weeks through 05/12/2020.  Please call if I can be of further assistance while he is hospitalized.  Cliffton Asters, MD [facility] for Infectious Disease [facility] [phone] pager   [phone] cell 04/04/2020, 4:20 PM

## 2020-04-04 NOTE — Transfer of Care (Signed)
Immediate Anesthesia Transfer of Care Note  Patient: Michael Price  Procedure(s) Performed: IRRIGATION AND DEBRIDEMENT LEFT UPPER ARM (Left Arm Upper)  Patient Location: PACU  Anesthesia Type:General  Level of Consciousness: awake, alert  and oriented  Airway & Oxygen Therapy: Patient Spontanous Breathing and Patient connected to face mask oxygen  Post-op Assessment: Report given to RN and Post -op Vital signs reviewed and stable  Post vital signs: Reviewed and stable  Last Vitals:  Vitals Value Taken Time  BP 143/74 04/04/20 0935  Temp 36.5 C 04/04/20 0935  Pulse 85 04/04/20 0943  Resp 16 04/04/20 0943  SpO2 100 % 04/04/20 0943  Vitals shown include unvalidated device data.  Last Pain:  Vitals:   04/04/20 0935  TempSrc:   PainSc: Asleep      Patients Stated Pain Goal: 1 (04/01/20 2047)  Complications: No complications documented.

## 2020-04-05 ENCOUNTER — Encounter (HOSPITAL_COMMUNITY): Payer: Self-pay | Admitting: Orthopedic Surgery

## 2020-04-05 DIAGNOSIS — M869 Osteomyelitis, unspecified: Secondary | ICD-10-CM

## 2020-04-05 DIAGNOSIS — S45109A Unspecified injury of brachial artery, unspecified side, initial encounter: Secondary | ICD-10-CM

## 2020-04-05 DIAGNOSIS — S42325B Nondisplaced transverse fracture of shaft of humerus, left arm, initial encounter for open fracture: Secondary | ICD-10-CM

## 2020-04-05 LAB — CBC
HCT: 27.2 % — ABNORMAL LOW (ref 39.0–52.0)
Hemoglobin: 8.4 g/dL — ABNORMAL LOW (ref 13.0–17.0)
MCH: 28.7 pg (ref 26.0–34.0)
MCHC: 30.9 g/dL (ref 30.0–36.0)
MCV: 92.8 fL (ref 80.0–100.0)
Platelets: 528 10*3/uL — ABNORMAL HIGH (ref 150–400)
RBC: 2.93 MIL/uL — ABNORMAL LOW (ref 4.22–5.81)
RDW: 16.3 % — ABNORMAL HIGH (ref 11.5–15.5)
WBC: 6.1 10*3/uL (ref 4.0–10.5)
nRBC: 0 % (ref 0.0–0.2)

## 2020-04-05 MED ORDER — ACETAMINOPHEN 500 MG PO TABS
500.0000 mg | ORAL_TABLET | Freq: Two times a day (BID) | ORAL | Status: DC
  Administered 2020-04-05 – 2020-04-09 (×8): 500 mg via ORAL
  Filled 2020-04-05 (×8): qty 1

## 2020-04-05 MED ORDER — KETOROLAC TROMETHAMINE 15 MG/ML IJ SOLN
15.0000 mg | Freq: Three times a day (TID) | INTRAMUSCULAR | Status: DC
  Administered 2020-04-05 – 2020-04-06 (×4): 15 mg via INTRAVENOUS
  Filled 2020-04-05 (×4): qty 1

## 2020-04-05 MED ORDER — METHOCARBAMOL 500 MG PO TABS
1000.0000 mg | ORAL_TABLET | Freq: Three times a day (TID) | ORAL | Status: DC
  Administered 2020-04-05 – 2020-04-06 (×3): 1000 mg via ORAL
  Filled 2020-04-05 (×5): qty 2

## 2020-04-05 MED ORDER — OXYCODONE-ACETAMINOPHEN 7.5-325 MG PO TABS
1.0000 | ORAL_TABLET | Freq: Four times a day (QID) | ORAL | Status: DC | PRN
  Administered 2020-04-05 – 2020-04-06 (×4): 2 via ORAL
  Filled 2020-04-05 (×4): qty 2

## 2020-04-05 MED ORDER — HYDROMORPHONE HCL 1 MG/ML IJ SOLN
1.0000 mg | Freq: Three times a day (TID) | INTRAMUSCULAR | Status: DC | PRN
  Administered 2020-04-05 – 2020-04-12 (×20): 1 mg via INTRAVENOUS
  Filled 2020-04-05 (×21): qty 1
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
## 2020-04-06 ENCOUNTER — Inpatient Hospital Stay (HOSPITAL_COMMUNITY): Payer: Self-pay

## 2020-04-06 ENCOUNTER — Encounter (HOSPITAL_COMMUNITY): Payer: Self-pay | Admitting: Orthopedic Surgery

## 2020-04-06 LAB — HEMOGLOBIN A1C
Hgb A1c MFr Bld: 5 % (ref 4.8–5.6)
Mean Plasma Glucose: 96.8 mg/dL

## 2020-04-06 LAB — VANCOMYCIN, TROUGH: Vancomycin Tr: 17 ug/mL (ref 15–20)

## 2020-04-06 MED ORDER — JUVEN PO PACK
1.0000 | PACK | Freq: Two times a day (BID) | ORAL | Status: DC
  Administered 2020-04-06 – 2020-04-14 (×8): 1 via ORAL
  Filled 2020-04-06 (×7): qty 1

## 2020-04-06 MED ORDER — ENSURE MAX PROTEIN PO LIQD
11.0000 [oz_av] | Freq: Every day | ORAL | Status: DC
  Administered 2020-04-06 – 2020-04-12 (×7): 11 [oz_av] via ORAL
  Filled 2020-04-06 (×8): qty 330

## 2020-04-06 MED ORDER — PREGABALIN 100 MG PO CAPS
100.0000 mg | ORAL_CAPSULE | Freq: Three times a day (TID) | ORAL | Status: DC
  Administered 2020-04-06 – 2020-04-14 (×24): 100 mg via ORAL
  Filled 2020-04-06 (×24): qty 1

## 2020-04-06 MED ORDER — JUVEN PO PACK: 1.0000 | PACK | Freq: Every day | ORAL | Status: DC

## 2020-04-06 MED ORDER — OXYCODONE HCL 5 MG PO TABS
5.0000 mg | ORAL_TABLET | ORAL | Status: DC | PRN
  Administered 2020-04-06 – 2020-04-09 (×6): 10 mg via ORAL
  Filled 2020-04-06 (×6): qty 2

## 2020-04-06 MED ORDER — ENSURE MAX PROTEIN PO LIQD: 11.0000 [oz_av] | Freq: Every day | ORAL | Status: DC

## 2020-04-06 MED ORDER — IOHEXOL 350 MG/ML SOLN
100.0000 mL | Freq: Once | INTRAVENOUS | Status: AC | PRN
  Administered 2020-04-06: 100 mL via INTRAVENOUS

## 2020-04-06 MED ORDER — METHOCARBAMOL 500 MG PO TABS
750.0000 mg | ORAL_TABLET | Freq: Four times a day (QID) | ORAL | Status: DC
  Administered 2020-04-06 – 2020-04-14 (×33): 750 mg via ORAL
  Filled 2020-04-06 (×35): qty 2

## 2020-04-06 MED ORDER — OXYCODONE-ACETAMINOPHEN 5-325 MG PO TABS
1.0000 | ORAL_TABLET | ORAL | Status: DC | PRN
  Administered 2020-04-06 – 2020-04-09 (×10): 2 via ORAL
  Filled 2020-04-06 (×11): qty 2

## 2020-04-06 MED ORDER — ASCORBIC ACID 500 MG PO TABS
1000.0000 mg | ORAL_TABLET | Freq: Every day | ORAL | Status: DC
  Administered 2020-04-06 – 2020-04-14 (×8): 1000 mg via ORAL
  Filled 2020-04-06 (×8): qty 2
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
## 2020-04-07 ENCOUNTER — Encounter (HOSPITAL_COMMUNITY)
Admission: EM | Disposition: A | Discharge status: Home Health | Payer: Self-pay | Source: Home / Self Care | Attending: Orthopedic Surgery

## 2020-04-07 ENCOUNTER — Inpatient Hospital Stay (HOSPITAL_COMMUNITY): Payer: Self-pay | Admitting: Anesthesiology

## 2020-04-07 ENCOUNTER — Encounter (HOSPITAL_COMMUNITY): Payer: Self-pay | Admitting: Orthopedic Surgery

## 2020-04-07 HISTORY — PX: I & D EXTREMITY: SHX5045

## 2020-04-07 LAB — COMPREHENSIVE METABOLIC PANEL
ALT: 29 U/L (ref 0–44)
AST: 20 U/L (ref 15–41)
Albumin: 2.7 g/dL — ABNORMAL LOW (ref 3.5–5.0)
Alkaline Phosphatase: 98 U/L (ref 38–126)
Anion gap: 10 (ref 5–15)
BUN: 10 mg/dL (ref 6–20)
CO2: 26 mmol/L (ref 22–32)
Calcium: 9 mg/dL (ref 8.9–10.3)
Chloride: 106 mmol/L (ref 98–111)
Creatinine, Ser: 0.87 mg/dL (ref 0.61–1.24)
GFR calc Af Amer: >60 mL/min (ref >60)
GFR calc non Af Amer: >60 mL/min (ref >60)
Glucose, Bld: 96 mg/dL (ref 70–99)
Potassium: 3.8 mmol/L (ref 3.5–5.1)
Sodium: 142 mmol/L (ref 135–145)
Total Bilirubin: 0.4 mg/dL (ref 0.3–1.2)
Total Protein: 5.9 g/dL — ABNORMAL LOW (ref 6.5–8.1)

## 2020-04-07 LAB — CBC
HCT: 28.8 % — ABNORMAL LOW (ref 39.0–52.0)
Hemoglobin: 8.4 g/dL — ABNORMAL LOW (ref 13.0–17.0)
MCH: 27.4 pg (ref 26.0–34.0)
MCHC: 29.2 g/dL — ABNORMAL LOW (ref 30.0–36.0)
MCV: 93.8 fL (ref 80.0–100.0)
Platelets: 531 10*3/uL — ABNORMAL HIGH (ref 150–400)
RBC: 3.07 MIL/uL — ABNORMAL LOW (ref 4.22–5.81)
RDW: 16.6 % — ABNORMAL HIGH (ref 11.5–15.5)
WBC: 4.6 10*3/uL (ref 4.0–10.5)
nRBC: 0 % (ref 0.0–0.2)

## 2020-04-07 SURGERY — IRRIGATION AND DEBRIDEMENT EXTREMITY
Anesthesia: General | Laterality: Left

## 2020-04-07 MED ORDER — OXYCODONE HCL 5 MG/5ML PO SOLN: 5.0000 mg | Freq: Once | ORAL | Status: DC | PRN

## 2020-04-07 MED ORDER — KETOROLAC TROMETHAMINE 30 MG/ML IJ SOLN
30.0000 mg | Freq: Once | INTRAMUSCULAR | Status: AC
  Administered 2020-04-07: 30 mg via INTRAVENOUS

## 2020-04-07 MED ORDER — OXYCODONE HCL 5 MG PO TABS: 5.0000 mg | ORAL_TABLET | Freq: Once | ORAL | Status: DC | PRN

## 2020-04-07 MED ORDER — LIDOCAINE 2% (20 MG/ML) 5 ML SYRINGE
INTRAMUSCULAR | Status: DC | PRN
  Administered 2020-04-07: 80 mg via INTRAVENOUS

## 2020-04-07 MED ORDER — FENTANYL CITRATE (PF) 250 MCG/5ML IJ SOLN
INTRAMUSCULAR | Status: AC
  Filled 2020-04-07: qty 5

## 2020-04-07 MED ORDER — DEXAMETHASONE SODIUM PHOSPHATE 10 MG/ML IJ SOLN
INTRAMUSCULAR | Status: AC
  Filled 2020-04-07: qty 1

## 2020-04-07 MED ORDER — KETAMINE HCL 10 MG/ML IJ SOLN
INTRAMUSCULAR | Status: DC | PRN
  Administered 2020-04-07: 50 mg via INTRAVENOUS

## 2020-04-07 MED ORDER — HYDROMORPHONE HCL 1 MG/ML IJ SOLN
INTRAMUSCULAR | Status: AC
  Filled 2020-04-07: qty 1

## 2020-04-07 MED ORDER — SODIUM CHLORIDE 0.9 % IR SOLN
Status: DC | PRN
  Administered 2020-04-07: 1000 mL

## 2020-04-07 MED ORDER — LIDOCAINE 2% (20 MG/ML) 5 ML SYRINGE
INTRAMUSCULAR | Status: AC
  Filled 2020-04-07: qty 5

## 2020-04-07 MED ORDER — OXYCODONE HCL 5 MG PO TABS
10.0000 mg | ORAL_TABLET | Freq: Once | ORAL | Status: AC
  Administered 2020-04-07: 10 mg via ORAL

## 2020-04-07 MED ORDER — PROMETHAZINE HCL 25 MG/ML IJ SOLN: 6.2500 mg | INTRAMUSCULAR | Status: DC | PRN

## 2020-04-07 MED ORDER — LACTATED RINGERS IV SOLN: INTRAVENOUS | Status: DC | PRN

## 2020-04-07 MED ORDER — MIDAZOLAM HCL 5 MG/5ML IJ SOLN
INTRAMUSCULAR | Status: DC | PRN
  Administered 2020-04-07: 2 mg via INTRAVENOUS

## 2020-04-07 MED ORDER — ONDANSETRON HCL 4 MG/2ML IJ SOLN
INTRAMUSCULAR | Status: DC | PRN
  Administered 2020-04-07: 4 mg via INTRAVENOUS

## 2020-04-07 MED ORDER — FENTANYL CITRATE (PF) 100 MCG/2ML IJ SOLN
INTRAMUSCULAR | Status: DC | PRN
  Administered 2020-04-07: 25 ug via INTRAVENOUS
  Administered 2020-04-07 (×2): 50 ug via INTRAVENOUS
  Administered 2020-04-07: 75 ug via INTRAVENOUS

## 2020-04-07 MED ORDER — ACETAMINOPHEN 10 MG/ML IV SOLN
INTRAVENOUS | Status: AC
  Filled 2020-04-07: qty 100

## 2020-04-07 MED ORDER — DEXAMETHASONE SODIUM PHOSPHATE 10 MG/ML IJ SOLN
INTRAMUSCULAR | Status: DC | PRN
Start: 2020-04-07 — End: 2020-04-07
  Administered 2020-04-07: 4 mg via INTRAVENOUS

## 2020-04-07 MED ORDER — MIDAZOLAM HCL 2 MG/2ML IJ SOLN
INTRAMUSCULAR | Status: AC
  Filled 2020-04-07: qty 2

## 2020-04-07 MED ORDER — 0.9 % SODIUM CHLORIDE (POUR BTL) OPTIME
TOPICAL | Status: DC | PRN
  Administered 2020-04-07: 1000 mL

## 2020-04-07 MED ORDER — ACETAMINOPHEN 10 MG/ML IV SOLN
INTRAVENOUS | Status: DC | PRN
Start: 2020-04-07 — End: 2020-04-07
  Administered 2020-04-07: 1000 mg via INTRAVENOUS

## 2020-04-07 MED ORDER — PROPOFOL 10 MG/ML IV BOLUS
INTRAVENOUS | Status: DC | PRN
  Administered 2020-04-07: 150 mg via INTRAVENOUS

## 2020-04-07 MED ORDER — OXYCODONE HCL 5 MG PO TABS
ORAL_TABLET | ORAL | Status: AC
  Filled 2020-04-07: qty 2

## 2020-04-07 MED ORDER — CHLORHEXIDINE GLUCONATE 4 % EX LIQD
CUTANEOUS | Status: AC
  Filled 2020-04-07: qty 15

## 2020-04-07 MED ORDER — ONDANSETRON HCL 4 MG/2ML IJ SOLN
INTRAMUSCULAR | Status: AC
  Filled 2020-04-07: qty 2

## 2020-04-07 MED ORDER — KETAMINE HCL 50 MG/5ML IJ SOSY
PREFILLED_SYRINGE | INTRAMUSCULAR | Status: AC
  Filled 2020-04-07: qty 5

## 2020-04-07 MED ORDER — ARTIFICIAL TEARS OPHTHALMIC OINT
TOPICAL_OINTMENT | OPHTHALMIC | Status: AC
  Filled 2020-04-07: qty 3.5

## 2020-04-07 MED ORDER — PROPOFOL 10 MG/ML IV BOLUS
INTRAVENOUS | Status: AC
  Filled 2020-04-07: qty 20

## 2020-04-07 MED ORDER — HYDROMORPHONE HCL 1 MG/ML IJ SOLN
0.2500 mg | INTRAMUSCULAR | Status: DC | PRN
  Administered 2020-04-07 (×4): 0.5 mg via INTRAVENOUS

## 2020-04-07 MED ORDER — KETOROLAC TROMETHAMINE 30 MG/ML IJ SOLN
INTRAMUSCULAR | Status: AC
  Filled 2020-04-07: qty 1

## 2020-04-07 SURGICAL SUPPLY — 41 items
BRUSH SCRUB EZ PLAIN DRY (MISCELLANEOUS) ×4 IMPLANT
CANISTER WOUNDNEG PRESSURE 500 (CANNISTER) ×2 IMPLANT
COVER SURGICAL LIGHT HANDLE (MISCELLANEOUS) ×4 IMPLANT
COVER WAND RF STERILE (DRAPES) IMPLANT
DRAPE INCISE IOBAN 66X45 STRL (DRAPES) ×2 IMPLANT
DRAPE U-SHAPE 47X51 STRL (DRAPES) ×3 IMPLANT
DRSG MEPITEL 4X7.2 (GAUZE/BANDAGES/DRESSINGS) ×2 IMPLANT
DRSG VAC ATS SM SENSATRAC (GAUZE/BANDAGES/DRESSINGS) ×2 IMPLANT
ELECT REM PT RETURN 9FT ADLT (ELECTROSURGICAL)
ELECTRODE REM PT RTRN 9FT ADLT (ELECTROSURGICAL) IMPLANT
GLOVE BIO SURGEON STRL SZ7.5 (GLOVE) ×3 IMPLANT
GLOVE BIO SURGEON STRL SZ8 (GLOVE) ×3 IMPLANT
GLOVE BIOGEL PI IND STRL 7.5 (GLOVE) ×1 IMPLANT
GLOVE BIOGEL PI IND STRL 8 (GLOVE) ×1 IMPLANT
GLOVE BIOGEL PI INDICATOR 7.5 (GLOVE) ×2
GLOVE BIOGEL PI INDICATOR 8 (GLOVE) ×2
GOWN STRL REUS W/ TWL LRG LVL3 (GOWN DISPOSABLE) ×2 IMPLANT
GOWN STRL REUS W/ TWL XL LVL3 (GOWN DISPOSABLE) ×1 IMPLANT
GOWN STRL REUS W/TWL LRG LVL3 (GOWN DISPOSABLE) ×6
GOWN STRL REUS W/TWL XL LVL3 (GOWN DISPOSABLE) ×3
KIT BASIN OR (CUSTOM PROCEDURE TRAY) ×3 IMPLANT
KIT TURNOVER KIT B (KITS) ×3 IMPLANT
MANIFOLD NEPTUNE II (INSTRUMENTS) ×3 IMPLANT
MATRIX WOUND 3-LAYER 10X15 (Tissue) ×1 IMPLANT
MICROMATRIX 1000MG (Tissue) ×6 IMPLANT
NS IRRIG 1000ML POUR BTL (IV SOLUTION) ×3 IMPLANT
PACK ORTHO EXTREMITY (CUSTOM PROCEDURE TRAY) ×3 IMPLANT
SOL PREP POV-IOD 4OZ 10% (MISCELLANEOUS) ×3 IMPLANT
SOL PREP PROV IODINE SCRUB 4OZ (MISCELLANEOUS) ×3 IMPLANT
SOLUTION PARTIC MCRMTRX 1000MG (Tissue) IMPLANT
SPONGE LAP 18X18 RF (DISPOSABLE) ×3 IMPLANT
SUT ETHILON 2 0 PSLX (SUTURE) ×2 IMPLANT
SUT PDS AB 2-0 CT1 27 (SUTURE) ×2 IMPLANT
SUT PDS AB 3-0 SH 27 (SUTURE) ×2 IMPLANT
TOWEL GREEN STERILE (TOWEL DISPOSABLE) ×6 IMPLANT
TUBE CONNECTING 12'X1/4 (SUCTIONS) ×1
TUBE CONNECTING 12X1/4 (SUCTIONS) ×2 IMPLANT
UNDERPAD 30X36 HEAVY ABSORB (UNDERPADS AND DIAPERS) ×3 IMPLANT
WATER STERILE IRR 1000ML POUR (IV SOLUTION) ×3 IMPLANT
WOUND MATRIX 3-LAYER 10X15 (Tissue) ×1 IMPLANT
YANKAUER SUCT BULB TIP NO VENT (SUCTIONS) ×3 IMPLANT

## 2020-04-07 NOTE — Anesthesia Procedure Notes (Signed)
Procedure Name: LMA Insertion Date/Time: 04/07/2020 8:23 AM Performed by: Demetrio Lapping, CRNA Pre-anesthesia Checklist: Patient identified, Emergency Drugs available, Suction available and Patient being monitored Patient Re-evaluated:Patient Re-evaluated prior to induction Oxygen Delivery Method: Circle System Utilized Preoxygenation: Pre-oxygenation with 100% oxygen Induction Type: IV induction LMA: LMA inserted LMA Size: 5.0 Number of attempts: 1 Airway Equipment and Method: Bite block Placement Confirmation: positive ETCO2 Tube secured with: Tape Dental Injury: Teeth and Oropharynx as per pre-operative assessment

## 2020-04-07 NOTE — Transfer of Care (Signed)
Immediate Anesthesia Transfer of Care Note  Patient: Michael Price  Procedure(s) Performed: IRRIGATION AND DEBRIDEMENT EXTREMITY, I&D L upper arm, vac change, acell (Left )  Patient Location: PACU  Anesthesia Type:General  Level of Consciousness: drowsy  Airway & Oxygen Therapy: Patient Spontanous Breathing and Patient connected to face mask oxygen  Post-op Assessment: Report given to RN and Post -op Vital signs reviewed and stable  Post vital signs: Reviewed and stable  Last Vitals:  Vitals Value Taken Time  BP 132/73 04/07/20 0956  Temp 36.7 C 04/07/20 0955  Pulse 75 04/07/20 1003  Resp 13 04/07/20 1003  SpO2 100 % 04/07/20 1003  Vitals shown include unvalidated device data.  Last Pain:  Vitals:   04/07/20 0955  TempSrc:   PainSc: (P) Asleep      Patients Stated Pain Goal: 2 (04/06/20 1958)  Complications: No complications documented.
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
## 2020-04-07 NOTE — Progress Notes (Signed)
I discussed with the patient the risks and benefits of surgery for his left arm, including the possibility of persistent or new infection, nerve injury, vessel injury, wound breakdown, DVT/ PE, loss of motion, malunion, nonunion, and need for further surgery among others. He acknowledged these risks and wished to proceed.  Myrene Galas, MD [facility], [facility] [phone]

## 2020-04-07 NOTE — Progress Notes (Signed)
Pt currently has a yellow MEWS score of 2, pt off the unit in surgery.

## 2020-04-07 NOTE — Anesthesia Postprocedure Evaluation (Signed)
Anesthesia Post Note  Patient: Michael Price  Procedure(s) Performed: IRRIGATION AND DEBRIDEMENT EXTREMITY, I&D L upper arm, vac change, acell (Left )     Patient location during evaluation: PACU Anesthesia Type: General Level of consciousness: awake and alert Pain management: pain level controlled Vital Signs Assessment: post-procedure vital signs reviewed and stable Respiratory status: spontaneous breathing, nonlabored ventilation, respiratory function stable and patient connected to nasal cannula oxygen Cardiovascular status: blood pressure returned to baseline and stable Postop Assessment: no apparent nausea or vomiting Anesthetic complications: no   No complications documented.  Last Vitals:  Vitals:   04/07/20 1145 04/07/20 1205  BP: (!) 121/59 139/85  Pulse: 82 82  Resp: 20 17  Temp:  36.8 C  SpO2: 98% 99%    Last Pain:  Vitals:   04/07/20 1413  TempSrc:   PainSc: 1                  Cecile Hearing

## 2020-04-08 ENCOUNTER — Encounter (HOSPITAL_COMMUNITY): Payer: Self-pay | Admitting: Orthopedic Surgery

## 2020-04-08 ENCOUNTER — Inpatient Hospital Stay (HOSPITAL_COMMUNITY): Payer: Self-pay

## 2020-04-08 LAB — AEROBIC/ANAEROBIC CULTURE W GRAM STAIN (SURGICAL/DEEP WOUND)

## 2020-04-08 NOTE — Plan of Care (Signed)
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
## 2020-04-09 MED ORDER — HYDROMORPHONE HCL 2 MG PO TABS
4.0000 mg | ORAL_TABLET | Freq: Four times a day (QID) | ORAL | Status: DC | PRN
  Administered 2020-04-09 – 2020-04-14 (×16): 4 mg via ORAL
  Filled 2020-04-09 (×16): qty 2

## 2020-04-09 MED ORDER — TRAMADOL HCL 50 MG PO TABS
50.0000 mg | ORAL_TABLET | Freq: Three times a day (TID) | ORAL | Status: DC
  Administered 2020-04-09 – 2020-04-14 (×12): 50 mg via ORAL
  Filled 2020-04-09 (×12): qty 1

## 2020-04-09 MED ORDER — ACETAMINOPHEN 500 MG PO TABS
1000.0000 mg | ORAL_TABLET | Freq: Four times a day (QID) | ORAL | Status: DC
  Administered 2020-04-09 – 2020-04-14 (×19): 1000 mg via ORAL
  Filled 2020-04-09 (×19): qty 2

## 2020-04-09 MED ORDER — NALOXONE HCL 0.4 MG/ML IJ SOLN: 0.4000 mg | INTRAMUSCULAR | Status: DC | PRN
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
## 2020-04-09 NOTE — Plan of Care (Signed)

## 2020-04-10 NOTE — Plan of Care (Signed)

## 2020-04-10 NOTE — Progress Notes (Signed)
Patient ID: Michael Price, male   DOB: 01-Mar-1976, 44 y.o.   MRN: [id number]    ORTHOPAEDIC PROGRESS NOTE  s/p Procedure(s): IRRIGATION AND DEBRIDEMENT EXTREMITY, I&D L upper arm, vac change, acell  SUBJECTIVE: Reports pain under better control today after switch to po dilaudid about operative site. No chest pain. No SOB. No nausea/vomiting. No other complaints.  OBJECTIVE: PE: He can make almost a composite fist this am with less difficulty and no assistance today Vitals:   04/09/20 2056 04/10/20 0416  BP: (!) 140/91 (!) 113/58  Pulse: 99 76  Resp: 16 14  Temp: 98.3 F (36.8 C) (!) 97.5 F (36.4 C)  SpO2: 100% 100%     ASSESSMENT: Michael Price is a 44 y.o. male doing well postoperatively.  PLAN: Weightbearing: NWB LUE Insicional and dressing care: Dressings left intact until follow-up Orthopedic device(s): Wound QDI:YMEBRAX well Showering: none VTE prophylaxis: Lovenox 40mg  qd  Pain control: IV dilaudid q 8 scheduled tramadol q 6 and po dilaudid prn Follow - up plan: back to the OR this week Contact information:

## 2020-04-10 NOTE — Progress Notes (Signed)
Occupational Therapy Treatment Patient Details Name: Michael Price MRN: [id number] DOB: 1975-10-06 Today's Date: 04/10/2020    History of present illness     OT comments  Pt. Seen for skilled OT session.  Focus of session cont. ROM of LUE.  Pt. Remains limited with c/o pain throughout session with any initiation, discussion, or completion of desired movements. Emotional support provided and adjusted the pace of movements within pts. Reported comfort.  Educated on positioning and rom for edema management to aide in increasing functional movement and ultimately aide with pain management.  Pt. Max encouragement for participation and completion of any rom. Reports pain as reason for all limitations and issues he is having. Cont. Education provided that even with meds he will have feelings of pain. Verbalizes understanding but continues to perseverate on pain limitations.  Able to complete rom and bed mobility with assistance.  Requesting sling to be placed at end of session. Reviewed with pt. That when awake and alert rec. For sling removal to engage in rom as able with goal for multiple times a day.  He verbalized understanding.  Repositioned in bed with sling in place per pt. Request. rn present at end of session to give requested meds. Pt. To also get ice pack for LUE.    Follow Up Recommendations  Home health OT;Supervision/Assistance - 24 hour    Equipment Recommendations  3 in 1 bedside commode    Recommendations for Other Services      Precautions / Restrictions Precautions Precautions: Fall;Posterior Hip;Shoulder Type of Shoulder Precautions: A/PROM L shoulder, unrestricted ROM L elbow to hand Shoulder Interventions: Don joy ultra sling Precaution Comments: pt needing cues for posterior hip precautions Required Braces or Orthoses: Sling       Mobility Bed Mobility Overal bed mobility: Modified Independent             General bed mobility comments: cues for NWB L UE and not  to cross legs at ankles  Transfers                      Balance                                           ADL either performed or assessed with clinical judgement   ADL Overall ADL's : Needs assistance/impaired                                             Vision       Perception     Praxis      Cognition Arousal/Alertness: Awake/alert Behavior During Therapy: WFL for tasks assessed/performed                                            Exercises General Exercises - Upper Extremity Shoulder Flexion: PROM;Left;10 reps;Supine Shoulder ABduction: AROM;Right;10 reps;Supine Shoulder Horizontal ABduction: PROM;Left;10 reps;Supine Shoulder Horizontal ADduction: PROM;Right;10 reps;Supine Elbow Flexion: AAROM;Left;10 reps;Supine;Seated Elbow Extension: AAROM;Left;10 reps;Supine;Seated Wrist Flexion: AAROM;10 reps;Seated;Left Wrist Extension: Left;10 reps;Seated;AAROM Digit Composite Flexion: PROM;AAROM;Left;5 reps;Seated Composite Extension: AROM;10 reps;Seated;Left   Shoulder Instructions       General Comments  Pertinent Vitals/ Pain       Pain Assessment: Faces Faces Pain Scale: Hurts whole lot Pain Location: Left arm Pain Descriptors / Indicators: Grimacing;Guarding;Operative site guarding;Restless Pain Intervention(s): Limited activity within patient's tolerance;Monitored during session;Repositioned;Patient requesting pain meds-RN notified;RN gave pain meds during session  Home Living                                          Prior Functioning/Environment              Frequency  Min 2X/week        Progress Toward Goals  OT Goals(current goals can now be found in the care plan section)  Progress towards OT goals: Progressing toward goals     Plan Discharge plan remains appropriate    Co-evaluation                 AM-PAC OT "6 Clicks" Daily Activity      Outcome Measure   Help from another person eating meals?: A Little Help from another person taking care of personal grooming?: A Little Help from another person toileting, which includes using toliet, bedpan, or urinal?: A Little Help from another person bathing (including washing, rinsing, drying)?: A Lot Help from another person to put on and taking off regular upper body clothing?: A Little Help from another person to put on and taking off regular lower body clothing?: A Lot 6 Click Score: 16    End of Session    OT Visit Diagnosis: Unsteadiness on feet (R26.81);Muscle weakness (generalized) (M62.81);Pain Pain - Right/Left: Left Pain - part of body: Arm   Activity Tolerance Patient limited by pain   Patient Left in bed;with call bell/phone within reach   Nurse Communication Patient requests pain meds        Time: 6283-6629 OT Time Calculation (min): 21 min  Charges: OT General Charges $OT Visit: 1 Visit OT Treatments $Therapeutic Exercise: 8-22 mins  Boneta Lucks, COTA/L Acute Rehabilitation [phone]   Robet Leu 04/10/2020, 11:00 AM

## 2020-04-10 NOTE — Plan of Care (Signed)
°  Problem: Education: Goal: Knowledge of General Education information will improve Description: Including pain rating scale, medication(s)/side effects and non-pharmacologic comfort measures Outcome: Progressing   Problem: Health Behavior/Discharge Planning: Goal: Ability to manage health-related needs will improve Outcome: Progressing   Problem: Clinical Measurements: Goal: Ability to maintain clinical measurements within normal limits will improve Outcome: Progressing   Problem: Activity: Goal: Risk for activity intolerance will decrease 04/10/2020 2355 by Noel Gerold A, RN Outcome: Progressing 04/10/2020 2328 by Vara Guardian Lyn A, RN Outcome: Progressing   Problem: Nutrition: Goal: Adequate nutrition will be maintained Outcome: Progressing   Problem: Coping: Goal: Level of anxiety will decrease 04/10/2020 2355 by Noel Gerold A, RN Outcome: Progressing 04/10/2020 2328 by Vara Guardian Lyn A, RN Outcome: Progressing   Problem: Elimination: Goal: Will not experience complications related to bowel motility Outcome: Progressing   Problem: Pain Managment: Goal: General experience of comfort will improve 04/10/2020 2355 by Noel Gerold A, RN Outcome: Progressing 04/10/2020 2328 by Vara Guardian Lyn A, RN Outcome: Progressing   Problem: Safety: Goal: Ability to remain free from injury will improve 04/10/2020 2355 by Aggie Moats, RN Outcome: Progressing 04/10/2020 2328 by Vara Guardian Lyn A, RN Outcome: Progressing   Problem: Skin Integrity: Goal: Risk for impaired skin integrity will decrease 04/10/2020 2355 by Aggie Moats, RN Outcome: Progressing 04/10/2020 2328 by Aggie Moats, RN Outcome: Progressing

## 2020-04-11 ENCOUNTER — Encounter (HOSPITAL_COMMUNITY): Payer: Self-pay | Admitting: Orthopedic Surgery
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
## 2020-04-11 NOTE — Plan of Care (Signed)
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
## 2020-04-12 ENCOUNTER — Inpatient Hospital Stay (HOSPITAL_COMMUNITY): Payer: Self-pay

## 2020-04-12 ENCOUNTER — Encounter (HOSPITAL_COMMUNITY)
Admission: EM | Disposition: A | Discharge status: Home Health | Payer: Self-pay | Source: Home / Self Care | Attending: Orthopedic Surgery

## 2020-04-12 ENCOUNTER — Encounter (HOSPITAL_COMMUNITY): Payer: Self-pay | Admitting: Orthopedic Surgery

## 2020-04-12 HISTORY — PX: SKIN SPLIT GRAFT: SHX444

## 2020-04-12 HISTORY — PX: I & D EXTREMITY: SHX5045

## 2020-04-12 LAB — TYPE AND SCREEN
ABO/RH(D): O POS
Antibody Screen: NEGATIVE

## 2020-04-12 LAB — CBC
HCT: 33.2 % — ABNORMAL LOW (ref 39.0–52.0)
Hemoglobin: 9.9 g/dL — ABNORMAL LOW (ref 13.0–17.0)
MCH: 28 pg (ref 26.0–34.0)
MCHC: 29.8 g/dL — ABNORMAL LOW (ref 30.0–36.0)
MCV: 93.8 fL (ref 80.0–100.0)
Platelets: 492 10*3/uL — ABNORMAL HIGH (ref 150–400)
RBC: 3.54 MIL/uL — ABNORMAL LOW (ref 4.22–5.81)
RDW: 16.3 % — ABNORMAL HIGH (ref 11.5–15.5)
WBC: 4.5 10*3/uL (ref 4.0–10.5)
nRBC: 0 % (ref 0.0–0.2)

## 2020-04-12 LAB — BASIC METABOLIC PANEL
Anion gap: 9 (ref 5–15)
BUN: 13 mg/dL (ref 6–20)
CO2: 25 mmol/L (ref 22–32)
Calcium: 9.2 mg/dL (ref 8.9–10.3)
Chloride: 105 mmol/L (ref 98–111)
Creatinine, Ser: 0.82 mg/dL (ref 0.61–1.24)
GFR calc Af Amer: >60 mL/min (ref >60)
GFR calc non Af Amer: >60 mL/min (ref >60)
Glucose, Bld: 98 mg/dL (ref 70–99)
Potassium: 4.2 mmol/L (ref 3.5–5.1)
Sodium: 139 mmol/L (ref 135–145)

## 2020-04-12 LAB — ABO/RH: ABO/RH(D): O POS

## 2020-04-12 LAB — VANCOMYCIN, TROUGH: Vancomycin Tr: 17 ug/mL (ref 15–20)

## 2020-04-12 SURGERY — IRRIGATION AND DEBRIDEMENT EXTREMITY
Anesthesia: General | Site: Arm Upper | Laterality: Left

## 2020-04-12 MED ORDER — BUPIVACAINE-EPINEPHRINE (PF) 0.25% -1:200000 IJ SOLN
INTRAMUSCULAR | Status: AC
  Filled 2020-04-12: qty 30

## 2020-04-12 MED ORDER — ONDANSETRON HCL 4 MG/2ML IJ SOLN
INTRAMUSCULAR | Status: DC | PRN
  Administered 2020-04-12: 4 mg via INTRAVENOUS

## 2020-04-12 MED ORDER — PROMETHAZINE HCL 25 MG/ML IJ SOLN: 6.2500 mg | INTRAMUSCULAR | Status: DC | PRN

## 2020-04-12 MED ORDER — LIDOCAINE HCL (CARDIAC) PF 100 MG/5ML IV SOSY
PREFILLED_SYRINGE | INTRAVENOUS | Status: DC | PRN
Start: 2020-04-12 — End: 2020-04-12
  Administered 2020-04-12: 100 mg via INTRAVENOUS

## 2020-04-12 MED ORDER — MINERAL OIL LIGHT 100 % EX OIL
TOPICAL_OIL | CUTANEOUS | Status: DC | PRN
  Administered 2020-04-12: 1 via TOPICAL

## 2020-04-12 MED ORDER — HYDROMORPHONE HCL 1 MG/ML IJ SOLN
0.2500 mg | INTRAMUSCULAR | Status: DC | PRN
  Administered 2020-04-12 (×4): 0.5 mg via INTRAVENOUS

## 2020-04-12 MED ORDER — FENTANYL CITRATE (PF) 250 MCG/5ML IJ SOLN
INTRAMUSCULAR | Status: AC
  Filled 2020-04-12: qty 5

## 2020-04-12 MED ORDER — FENTANYL CITRATE (PF) 250 MCG/5ML IJ SOLN
INTRAMUSCULAR | Status: DC | PRN
  Administered 2020-04-12: 25 ug via INTRAVENOUS
  Administered 2020-04-12 (×2): 50 ug via INTRAVENOUS
  Administered 2020-04-12: 25 ug via INTRAVENOUS

## 2020-04-12 MED ORDER — HYDROMORPHONE HCL 1 MG/ML IJ SOLN
INTRAMUSCULAR | Status: AC
  Filled 2020-04-12: qty 1

## 2020-04-12 MED ORDER — PHENYLEPHRINE 40 MCG/ML (10ML) SYRINGE FOR IV PUSH (FOR BLOOD PRESSURE SUPPORT)
PREFILLED_SYRINGE | INTRAVENOUS | Status: AC
  Filled 2020-04-12: qty 20

## 2020-04-12 MED ORDER — DEXAMETHASONE SODIUM PHOSPHATE 10 MG/ML IJ SOLN
INTRAMUSCULAR | Status: AC
  Filled 2020-04-12: qty 2

## 2020-04-12 MED ORDER — KETAMINE HCL 50 MG/5ML IJ SOSY
PREFILLED_SYRINGE | INTRAMUSCULAR | Status: AC
  Filled 2020-04-12: qty 5

## 2020-04-12 MED ORDER — DEXAMETHASONE SODIUM PHOSPHATE 10 MG/ML IJ SOLN
INTRAMUSCULAR | Status: DC | PRN
Start: 2020-04-12 — End: 2020-04-12
  Administered 2020-04-12: 10 mg via INTRAVENOUS

## 2020-04-12 MED ORDER — PROPOFOL 10 MG/ML IV BOLUS
INTRAVENOUS | Status: AC
  Filled 2020-04-12: qty 20

## 2020-04-12 MED ORDER — PROPOFOL 10 MG/ML IV BOLUS
INTRAVENOUS | Status: DC | PRN
Start: 2020-04-12 — End: 2020-04-12
  Administered 2020-04-12: 200 mg via INTRAVENOUS

## 2020-04-12 MED ORDER — HYDROMORPHONE HCL 1 MG/ML IJ SOLN
INTRAMUSCULAR | Status: AC
  Filled 2020-04-12: qty 0.5

## 2020-04-12 MED ORDER — ONDANSETRON HCL 4 MG/2ML IJ SOLN
INTRAMUSCULAR | Status: AC
  Filled 2020-04-12: qty 4

## 2020-04-12 MED ORDER — LACTATED RINGERS IV SOLN: INTRAVENOUS | Status: DC | PRN

## 2020-04-12 MED ORDER — KETAMINE HCL 10 MG/ML IJ SOLN
INTRAMUSCULAR | Status: DC | PRN
Start: 2020-04-12 — End: 2020-04-12
  Administered 2020-04-12: 30 mg via INTRAVENOUS
  Administered 2020-04-12 (×2): 10 mg via INTRAVENOUS

## 2020-04-12 MED ORDER — VANCOMYCIN HCL IN DEXTROSE 1-5 GM/200ML-% IV SOLN
1000.0000 mg | Freq: Three times a day (TID) | INTRAVENOUS | Status: DC
  Administered 2020-04-12 – 2020-04-14 (×7): 1000 mg via INTRAVENOUS
  Filled 2020-04-12 (×9): qty 200

## 2020-04-12 MED ORDER — MIDAZOLAM HCL 2 MG/2ML IJ SOLN
INTRAMUSCULAR | Status: DC | PRN
Start: 2020-04-12 — End: 2020-04-12
  Administered 2020-04-12: 2 mg via INTRAVENOUS

## 2020-04-12 MED ORDER — BUPIVACAINE-EPINEPHRINE (PF) 0.25% -1:200000 IJ SOLN
INTRAMUSCULAR | Status: DC | PRN
  Administered 2020-04-12: 10 mL
  Administered 2020-04-12: 20 mL

## 2020-04-12 MED ORDER — PHENYLEPHRINE HCL (PRESSORS) 10 MG/ML IV SOLN
INTRAVENOUS | Status: DC | PRN
  Administered 2020-04-12 (×5): 80 ug via INTRAVENOUS

## 2020-04-12 MED ORDER — HYDROMORPHONE HCL 1 MG/ML IJ SOLN
1.0000 mg | INTRAMUSCULAR | Status: DC | PRN
  Administered 2020-04-12 – 2020-04-14 (×11): 1 mg via INTRAVENOUS
  Filled 2020-04-12 (×11): qty 1

## 2020-04-12 MED ORDER — MIDAZOLAM HCL 2 MG/2ML IJ SOLN
INTRAMUSCULAR | Status: AC
  Filled 2020-04-12: qty 2

## 2020-04-12 MED ORDER — 0.9 % SODIUM CHLORIDE (POUR BTL) OPTIME
TOPICAL | Status: DC | PRN
  Administered 2020-04-12: 1000 mL

## 2020-04-12 MED ORDER — HYDROMORPHONE HCL 1 MG/ML IJ SOLN
INTRAMUSCULAR | Status: DC | PRN
  Administered 2020-04-12: .5 mg via INTRAVENOUS

## 2020-04-12 SURGICAL SUPPLY — 70 items
BALL CTTN LRG ABS STRL LF (GAUZE/BANDAGES/DRESSINGS) ×2
BLADE CLIPPER SURG (BLADE) IMPLANT
BLADE DERMATOME SS (BLADE) ×2 IMPLANT
BNDG COHESIVE 4X5 TAN STRL (GAUZE/BANDAGES/DRESSINGS) ×3 IMPLANT
BNDG ELASTIC 6X5.8 VLCR STR LF (GAUZE/BANDAGES/DRESSINGS) ×4 IMPLANT
BNDG GAUZE ELAST 4 BULKY (GAUZE/BANDAGES/DRESSINGS) ×4 IMPLANT
BRUSH SCRUB EZ PLAIN DRY (MISCELLANEOUS) ×6 IMPLANT
CANISTER SUCT 3000ML PPV (MISCELLANEOUS) IMPLANT
CANISTER WOUNDNEG PRESSURE 500 (CANNISTER) ×2 IMPLANT
COTTONBALL LRG STERILE PKG (GAUZE/BANDAGES/DRESSINGS) ×6 IMPLANT
COVER SURGICAL LIGHT HANDLE (MISCELLANEOUS) ×6 IMPLANT
COVER WAND RF STERILE (DRAPES) ×3 IMPLANT
DEPRESSOR TONGUE BLADE STERILE (MISCELLANEOUS) ×2 IMPLANT
DERMACARRIERS GRAFT 1 TO 1.5 (DISPOSABLE) ×3
DRAPE HALF SHEET 40X57 (DRAPES) ×6 IMPLANT
DRAPE ORTHO SPLIT 77X108 STRL (DRAPES) ×3
DRAPE SURG ORHT 6 SPLT 77X108 (DRAPES) ×1 IMPLANT
DRAPE U-SHAPE 47X51 STRL (DRAPES) ×3 IMPLANT
DRSG ADAPTIC 3X8 NADH LF (GAUZE/BANDAGES/DRESSINGS) ×5 IMPLANT
DRSG MEPITEL 4X7.2 (GAUZE/BANDAGES/DRESSINGS) ×2 IMPLANT
DRSG PAD ABDOMINAL 8X10 ST (GAUZE/BANDAGES/DRESSINGS) ×4 IMPLANT
DRSG VAC ATS MED SENSATRAC (GAUZE/BANDAGES/DRESSINGS) ×2 IMPLANT
ELECT REM PT RETURN 9FT ADLT (ELECTROSURGICAL)
ELECTRODE REM PT RTRN 9FT ADLT (ELECTROSURGICAL) IMPLANT
GAUZE 4X4 16PLY RFD (DISPOSABLE) ×3 IMPLANT
GAUZE SPONGE 4X4 12PLY STRL (GAUZE/BANDAGES/DRESSINGS) ×4 IMPLANT
GAUZE XEROFORM 5X9 LF (GAUZE/BANDAGES/DRESSINGS) ×2 IMPLANT
GLOVE BIO SURGEON STRL SZ7.5 (GLOVE) ×3 IMPLANT
GLOVE BIO SURGEON STRL SZ8 (GLOVE) ×3 IMPLANT
GLOVE BIOGEL PI IND STRL 7.5 (GLOVE) ×1 IMPLANT
GLOVE BIOGEL PI IND STRL 8 (GLOVE) ×1 IMPLANT
GLOVE BIOGEL PI IND STRL 9 (GLOVE) ×1 IMPLANT
GLOVE BIOGEL PI INDICATOR 7.5 (GLOVE) ×2
GLOVE BIOGEL PI INDICATOR 8 (GLOVE) ×2
GLOVE BIOGEL PI INDICATOR 9 (GLOVE) ×2
GOWN STRL REUS W/ TWL LRG LVL3 (GOWN DISPOSABLE) ×2 IMPLANT
GOWN STRL REUS W/ TWL XL LVL3 (GOWN DISPOSABLE) ×1 IMPLANT
GOWN STRL REUS W/TWL LRG LVL3 (GOWN DISPOSABLE) ×6
GOWN STRL REUS W/TWL XL LVL3 (GOWN DISPOSABLE) ×3
GRAFT DERMACARRIERS 1 TO 1.5 (DISPOSABLE) ×1 IMPLANT
HANDPIECE INTERPULSE COAX TIP (DISPOSABLE)
KIT BASIN OR (CUSTOM PROCEDURE TRAY) ×3 IMPLANT
KIT TURNOVER KIT B (KITS) ×3 IMPLANT
MANIFOLD NEPTUNE II (INSTRUMENTS) ×3 IMPLANT
NS IRRIG 1000ML POUR BTL (IV SOLUTION) ×3 IMPLANT
PACK GENERAL/GYN (CUSTOM PROCEDURE TRAY) ×3 IMPLANT
PACK ORTHO EXTREMITY (CUSTOM PROCEDURE TRAY) ×3 IMPLANT
PAD ARMBOARD 7.5X6 YLW CONV (MISCELLANEOUS) ×6 IMPLANT
PAD CAST 4YDX4 CTTN HI CHSV (CAST SUPPLIES) ×1 IMPLANT
PADDING CAST COTTON 4X4 STRL (CAST SUPPLIES) ×3
PADDING CAST COTTON 6X4 STRL (CAST SUPPLIES) ×3 IMPLANT
SET HNDPC FAN SPRY TIP SCT (DISPOSABLE) IMPLANT
SOL PREP POV-IOD 4OZ 10% (MISCELLANEOUS) ×3 IMPLANT
SOL PREP PROV IODINE SCRUB 4OZ (MISCELLANEOUS) ×3 IMPLANT
SPONGE LAP 18X18 RF (DISPOSABLE) ×3 IMPLANT
STAPLER VISISTAT (STAPLE) ×3 IMPLANT
STAPLER VISISTAT 35W (STAPLE) ×3 IMPLANT
STOCKINETTE 4X48 STRL (DRAPES) ×3 IMPLANT
STOCKINETTE IMPERVIOUS 9X36 MD (GAUZE/BANDAGES/DRESSINGS) IMPLANT
STOCKINETTE IMPERVIOUS LG (DRAPES) IMPLANT
SUT ETHILON 3 0 FSL (SUTURE) IMPLANT
SUT PDS AB 2-0 CT1 27 (SUTURE) IMPLANT
SUT PROLENE 4 0 PS 2 18 (SUTURE) ×6 IMPLANT
TOWEL GREEN STERILE (TOWEL DISPOSABLE) ×6 IMPLANT
TOWEL GREEN STERILE FF (TOWEL DISPOSABLE) ×3 IMPLANT
TUBE CONNECTING 12'X1/4 (SUCTIONS) ×1
TUBE CONNECTING 12X1/4 (SUCTIONS) ×2 IMPLANT
UNDERPAD 30X36 HEAVY ABSORB (UNDERPADS AND DIAPERS) ×3 IMPLANT
WATER STERILE IRR 1000ML POUR (IV SOLUTION) ×3 IMPLANT
YANKAUER SUCT BULB TIP NO VENT (SUCTIONS) ×3 IMPLANT
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
## 2020-04-12 NOTE — Anesthesia Procedure Notes (Signed)
Procedure Name: LMA Insertion Date/Time: 04/12/2020 8:12 AM Performed by: Dairl Ponder, CRNA Pre-anesthesia Checklist: Patient identified, Emergency Drugs available, Suction available and Patient being monitored Patient Re-evaluated:Patient Re-evaluated prior to induction Oxygen Delivery Method: Circle System Utilized Preoxygenation: Pre-oxygenation with 100% oxygen Induction Type: IV induction Ventilation: Mask ventilation without difficulty LMA: LMA inserted LMA Size: 5.0 Number of attempts: 1 Placement Confirmation: positive ETCO2 Tube secured with: Tape Dental Injury: Teeth and Oropharynx as per pre-operative assessment  Comments: Performed by Letta Pate, srna

## 2020-04-12 NOTE — Anesthesia Postprocedure Evaluation (Signed)
Anesthesia Post Note  Patient: Michael Price  Procedure(s) Performed: IRRIGATION AND DEBRIDEMENT EXTREMITY left upper extremity (Left Arm Upper) split thickness skin graft left arm (Left Arm Upper)     Patient location during evaluation: PACU Anesthesia Type: General Level of consciousness: awake and alert Pain management: pain level controlled Vital Signs Assessment: post-procedure vital signs reviewed and stable Respiratory status: spontaneous breathing, nonlabored ventilation, respiratory function stable and patient connected to nasal cannula oxygen Cardiovascular status: blood pressure returned to baseline and stable Postop Assessment: no apparent nausea or vomiting Anesthetic complications: no   No complications documented.  Last Vitals:  Vitals:   04/12/20 1055 04/12/20 1110  BP: (!) 151/89 136/87  Pulse: 92 90  Resp: 17 19  Temp:  (!) 36.3 C  SpO2: 97% 99%    Last Pain:  Vitals:   04/12/20 1205  TempSrc:   PainSc: 8                  Candra R Shalan Neault
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
## 2020-04-12 NOTE — Progress Notes (Signed)
Consult received : Home wound vac for split thickness skin graft (STSG). Referral made with [employer] for DME : wound vac.Pt without health insurance, charity application completed by pt.Wound vac script  left on front of chart for MD to complete. Once completed , script will be faxed to [facility] ....  TOC team will continue to monitor and follow. Gae Gallop RN,BSN,CM [phone]

## 2020-04-12 NOTE — Progress Notes (Signed)
Pharmacy Antibiotic Note  Michael Price is a 44 y.o. male admitted on 03/30/2020 with MRSA L humerus abscess and possible osteomyelitis, s/p multiple I&D's. Pharmacy has been consulted for vancomycin dosing.  Renal function stable and vancomycin trough remains therapeutic at 17 mcg/mL (goal 15-20 mcg/mL).  Afebrile, WBC WNL, LFTs WNL, QTc .  Plan: Continue vanc 1gm IV Q8H Rifampin 600mg  PO daily Levaquin 750mg  PO daily Monitor renal fxn, clinical progress, weekly vanc trough   Height: 5\' 11"  (180.3 cm) Weight: 117 kg (257 lb 15 oz) IBW/kg (Calculated) : 75.3  Temp (24hrs), Avg:97.7 F (36.5 C), Min:97.3 F (36.3 C), Max:98.3 F (36.8 C)  Recent Labs  Lab 04/06/20 0453 04/07/20 0407 04/12/20 0440  WBC  --  4.6 4.5  CREATININE  --  0.87 0.82  VANCOTROUGH 17  --  17    Estimated Creatinine Clearance: 151.2 mL/min (by C-G formula based on SCr of 0.82 mg/dL).    Not on File  Zosyn 8/5  >> 8/7 Rifampin PO 8/7 >> Levaquin PO 8/9 >> Vanc 8/7 >> (9/16)  8/11 VT = 17 mcg/mL on 1g q8 >> no change 8/17 VT = 17 mcg/mL >> no change  8/4 BCx - negative 8/5 tissue cx - MRSA 8/5 wound cx - MRSA  Tylea Hise D. 10/11, PharmD, BCPS, BCCCP 04/12/2020, 12:10 PM

## 2020-04-12 NOTE — Transfer of Care (Signed)
Immediate Anesthesia Transfer of Care Note  Patient: Michael Price  Procedure(s) Performed: IRRIGATION AND DEBRIDEMENT EXTREMITY left upper extremity (Left Arm Upper) split thickness skin graft left arm (Left Arm Upper)  Patient Location: PACU  Anesthesia Type:General  Level of Consciousness: drowsy and patient cooperative  Airway & Oxygen Therapy: Patient Spontanous Breathing and Patient connected to face mask  Post-op Assessment: Report given to RN and Post -op Vital signs reviewed and stable  Post vital signs: Reviewed and stable  Last Vitals:  Vitals Value Taken Time  BP 121/66 04/12/20 1008  Temp    Pulse 85 04/12/20 1009  Resp 17 04/12/20 1009  SpO2 95 % 04/12/20 1009  Vitals shown include unvalidated device data.  Last Pain:  Vitals:   04/12/20 0530  TempSrc:   PainSc: 3       Patients Stated Pain Goal: 3 (04/07/20 1010)  Complications: No complications documented.

## 2020-04-12 NOTE — Plan of Care (Signed)

## 2020-04-12 NOTE — Op Note (Signed)
NAME: Michael Price, Michael Price MEDICAL RECORD [id number] ACCOUNT [id number] DATE OF BIRTH:09/30/1975 FACILITY: [facility] LOCATION: [facility] PHYSICIAN:Simuel Stebner H. Harshita Bernales, MD  OPERATIVE REPORT  DATE OF PROCEDURE:  04/12/2020  PREOPERATIVE DIAGNOSIS:  Traumatic open wound, left shoulder after infection following grade IIIC open left humerus fracture.  POSTOPERATIVE DIAGNOSIS:  Traumatic open wound, left shoulder after infection following grade IIIC open left humerus fracture.  PROCEDURES: 1.  Irrigation and debridement of subcutaneous tissue and muscle. 2.  Split thickness skin grafting, 10 x 10 cm. 3.  Wound VAC dressing change under anesthesia.  SURGEON:  Myrene Galas, MD  ASSISTANT:  Montez Morita, PA-C.  ANESTHESIA:  General.  COMPLICATIONS:  None.   ESTIMATED BLOOD LOSS:  Minimal.  DISPOSITION:  To PACU.  CONDITION:  Stable.  INDICATIONS FOR PROCEDURE:  The patient is a 44 year old male, well known to the orthopedic trauma service for multiple procedures related to his open fracture on the left, complicated by infection.  The patient presents for definitive grafting after a  preparation procedure several days ago.  The patient and I discussed the risks and benefits of surgery, including failure of the graft, scarring, infection, wound healing problems from the donor or recipient site and need for further surgery, among  others.  After full discussion, he did provide consent to proceed.  BRIEF SUMMARY OF PROCEDURE:  The patient remained on scheduled antibiotics for his polymicrobial infection.  We began with removal of the old VAC and debridement of some devitalized subcutaneous tissue superiorly and a little bit of muscle inferiorly,  but otherwise the preparation for the graft had been quite successful.  I was able to remove some of these tacked down sutures.  The wound was irrigated thoroughly.  We then harvested in 2 strips, a total of what became 100 cm2 of split thickness skin   grafting after being mesh.  This was secured in place with running and interrupted 4-0 Prolene suture.  We then reapplied the wound VAC dressing, first placing a layer of Mepitel over the graft and then a black sponge.  A sterile gently compressive  dressing was applied over this.  The donor site was treated with an immediate application of Xeroform and Marcaine and epinephrine-soaked sponges.  This was followed by a compressive dressing with tape and 2 layers of Adaptic between the Xeroform and the  dressing to facilitate removal and application of a fan tomorrow.  Montez Morita, PA-C, was present and assisting throughout.  An assistant was absolutely necessary to obtain and place the graft.  PROGNOSIS:  The patient will be in the hospital tomorrow for removal of the donor site dressing, application of a fan.  If we were able to arrange for home VAC conversion, he may be able to go out thereafter, with plans to return in the next several days  for removal of the recipient site VAC.  He will continue on the antibiotics under the care of Dr. Cliffton Asters.  He remains at high risk for complications including wound breakdown and persistent infection, as well as nonunion.  VN/NUANCE  D:04/12/2020 T:04/12/2020 JOB:[id number]

## 2020-04-12 NOTE — OR Nursing (Signed)
During assessment patient reported that no pain medication had been given since the previous night.  Upon conferring with short stay staff, received report that pain medication had been given at 0500 this  Morning.

## 2020-04-13 ENCOUNTER — Encounter (HOSPITAL_COMMUNITY): Payer: Self-pay | Admitting: Orthopedic Surgery

## 2020-04-13 MED ORDER — ENSURE MAX PROTEIN PO LIQD
11.0000 [oz_av] | Freq: Every day | ORAL | Status: DC
  Administered 2020-04-13: 11 [oz_av] via ORAL
  Filled 2020-04-13 (×2): qty 330
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
## 2020-04-14 ENCOUNTER — Encounter (HOSPITAL_COMMUNITY): Payer: Self-pay | Admitting: Orthopedic Surgery

## 2020-04-14 MED ORDER — METHOCARBAMOL 500 MG PO TABS: 500.0000 mg | ORAL_TABLET | Freq: Three times a day (TID) | ORAL | 0 refills | Status: DC | PRN

## 2020-04-14 MED ORDER — RIFAMPIN 300 MG PO CAPS: 600.0000 mg | ORAL_CAPSULE | Freq: Every day | ORAL | 0 refills | Status: DC

## 2020-04-14 MED ORDER — ASCORBIC ACID 1000 MG PO TABS: 1000.0000 mg | ORAL_TABLET | Freq: Every day | ORAL | 1 refills | Status: AC

## 2020-04-14 MED ORDER — TRAMADOL HCL 50 MG PO TABS: 50.0000 mg | ORAL_TABLET | Freq: Three times a day (TID) | ORAL | 0 refills | Status: DC | PRN

## 2020-04-14 MED ORDER — VANCOMYCIN IV (FOR PTA / DISCHARGE USE ONLY): 1000.0000 mg | Freq: Three times a day (TID) | INTRAVENOUS | 0 refills | Status: DC

## 2020-04-14 MED ORDER — HYDROMORPHONE HCL 4 MG PO TABS: 4.0000 mg | ORAL_TABLET | Freq: Four times a day (QID) | ORAL | 0 refills | Status: DC | PRN

## 2020-04-14 MED ORDER — NALOXONE HCL 4 MG/0.1ML NA LIQD: NASAL | 1 refills | Status: AC

## 2020-04-14 MED ORDER — VITAMIN D3 25 MCG PO TABS: 2000.0000 [IU] | ORAL_TABLET | Freq: Two times a day (BID) | ORAL | 11 refills | Status: AC

## 2020-04-14 MED ORDER — HEPARIN SOD (PORK) LOCK FLUSH 100 UNIT/ML IV SOLN
250.0000 [IU] | INTRAVENOUS | Status: AC | PRN
  Administered 2020-04-14: 250 [IU]
  Filled 2020-04-14: qty 2.5

## 2020-04-14 MED ORDER — LEVOFLOXACIN 750 MG PO TABS: 750.0000 mg | ORAL_TABLET | Freq: Every day | ORAL | 0 refills | Status: DC

## 2020-04-14 MED ORDER — ENSURE MAX PROTEIN PO LIQD: 11.0000 [oz_av] | Freq: Every day | ORAL | 0 refills | Status: AC

## 2020-04-14 MED ORDER — ACETAMINOPHEN 500 MG PO TABS: 1000.0000 mg | ORAL_TABLET | Freq: Four times a day (QID) | ORAL | 0 refills | Status: AC

## 2020-04-14 MED ORDER — ADULT MULTIVITAMIN W/MINERALS CH: 1.0000 | ORAL_TABLET | Freq: Every day | ORAL | 3 refills | Status: AC

## 2020-04-14 MED FILL — HYDROmorphone HCL 4 MG TABS: 4 | 10 days supply | Qty: 40 | Fill #0

## 2020-04-14 MED FILL — traMADol HCL 50 MG TABS: 50 | 10 days supply | Qty: 30 | Fill #0

## 2020-04-14 MED FILL — VITAMIN C 500 MG TABLET: 500 | 30 days supply | Qty: 60 | Fill #0

## 2020-04-14 MED FILL — VITAMIN D3 25 MCG TABS: 25 | 30 days supply | Qty: 120 | Fill #0

## 2020-04-14 MED FILL — ACETAMINOPHEN 500MG XT STRE: 500 | 4 days supply | Qty: 30 | Fill #0

## 2020-04-14 MED FILL — levoFLOXacin 750 MG TABS: 750 | 30 days supply | Qty: 30 | Fill #0

## 2020-04-14 MED FILL — METHOCARBAMOL 500 MG TABS: 500 | 10 days supply | Qty: 60 | Fill #0

## 2020-04-14 MED FILL — rifAMPin 300 MG CAPS: 300 | 30 days supply | Qty: 60 | Fill #0

## 2020-04-14 MED FILL — NARCAN 4 MG NASAL SPRAY: 4 | 2 days supply | Qty: 2 | Fill #0

## 2020-04-14 NOTE — Progress Notes (Signed)
Orthopaedic Trauma Service (OTS)  2 Days Post-Op Procedure(s) (LRB): IRRIGATION AND DEBRIDEMENT EXTREMITY left upper extremity (Left) split thickness skin graft left arm (Left)  Subjective: Patient reports pain as mild.    Objective: Current Vitals Blood pressure 133/86, pulse 86, temperature 97.9 F (36.6 C), temperature source Oral, resp. rate 16, height 5\' 11"  (1.803 m), weight 117 kg, SpO2 100 %. Vital signs in last 24 hours: Temp:  [97.9 F (36.6 C)-98.7 F (37.1 C)] 97.9 F (36.6 C) (08/19 0753) Pulse Rate:  [86-90] 86 (08/19 0753) Resp:  [16-17] 16 (08/19 0753) BP: (126-134)/(68-86) 133/86 (08/19 0753) SpO2:  [99 %-100 %] 100 % (08/19 0753)  Intake/Output from previous day: 08/18 0701 - 08/19 0700 In: 1200 [P.O.:1200] Out: 2200 [Urine:2200]  LABS Recent Labs    04/12/20 0440  HGB 9.9*   Recent Labs    04/12/20 0440  WBC 4.5  RBC 3.54*  HCT 33.2*  PLT 492*   Recent Labs    04/12/20 0440  NA 139  K 4.2  CL 105  CO2 25  BUN 13  CREATININE 0.82  GLUCOSE 98  CALCIUM 9.2   No results for input(s): LABPT, INR in the last 72 hours.   Physical Exam LUE  Sling in place  Sens  Ax/R/M/U intact  Mot   Ax/ R/ PIN/ M/ AIN/ U intact  Brisk CR LLE  Dressing intact, fans applied and xeroform trimmed  Assessment/Plan: 2 Days Post-Op Procedure(s) (LRB): IRRIGATION AND DEBRIDEMENT EXTREMITY left upper extremity (Left) split thickness skin graft left arm (Left) 1. PT/OT  2. DVT proph Lovenox 3. F/u next Wed if d/c'd with wound vac o/w remove tom pm but high risk given previous course at home  04/14/20, MD [facility], [facility] [phone]

## 2020-04-14 NOTE — TOC Transition Note (Signed)
Transition of Care [facility]) - CM/SW Discharge Note   Patient Details  Name: Michael Price MRN: [id number] Date of Birth: April 04, 1976  Transition of Care [facility]) CM/SW Contact:  Epifanio Lesches, RN Phone Number: 04/14/2020, 2:07 PM   Clinical Narrative:     Patient will DC to: home with mom Anticipated DC date:04/14/2020 Family notified:yes Transport by: mom, car  Per MD patient ready for DC today.RN, patient, and patient's family  of DC plan. Pt states will recover @ mom's residence :[address], [place] [address].Cell # [phone]. [facility] to provide IV ABX therapy. [facility] liaison , Pam , will provide teaching to pt and mom @ bedside prior to d/c. [facility] [employer] agency will provide [facility].... LOG granted per [facility] department for [facility] services.  DME  wound vac will be delivered to bedside before d/c by [employer] rep.  TOC pharmacy will deliver  Rx meds to bedside.   Mom to provide transportation to home.    RNCM will sign off for now as intervention is no longer needed. Please consult [place] again if new needs arise.   Final next level of care: Home w Home Health Services Barriers to Discharge: No Barriers Identified   Patient Goals and CMS Choice     Choice offered to / list presented to : Patient  Discharge Placement                       Discharge Plan and Services                DME Arranged: Vac DME Agency: [employer] Date DME Agency Contacted: 04/12/20 Time DME Agency Contacted: 1600 Representative spoke with at DME Agency: French Ana            Social Determinants of Health (SDOH) Interventions     Readmission Risk Interventions Readmission Risk Prevention Plan 03/24/2020  Post Dischage Appt Not Complete  Appt Comments Pt will have to schedule eligibility appt prior to PCP scheduled  Medication Screening Complete  Transportation Screening Complete  Some recent data might be hidden

## 2020-04-14 NOTE — Plan of Care (Signed)

## 2020-04-14 NOTE — Plan of Care (Signed)

## 2020-04-14 NOTE — Discharge Instructions (Signed)
[facility] Discharge Instructions   General Discharge Instructions   WEIGHT BEARING STATUS:  Nonweightbearing Left upper extremity. No shoulder range of motion. Gentle elbow, forearm, wrist and hand motion.  Touchdown weightbearing Right leg  RANGE OF MOTION/ACTIVITY: as above. Posterior hip precautions right hip  Bone health:  Continue with vitamin d and vitamin c supplements for bone and wound healing   Wound Care: do not remove wound vac L arm.  Ensure battery is always charged. Please call office with any issues   Donor site L leg  Donor site instructions   DO NOT remove yellow xeroform layer for any reason  Pt to dab blood droplets as they form  Fan to xeroform layer to dry out site/sites. Once xeroform layer is dry you can stop the fan  DO NOT cover donor sites with any dressings, allow them to stay  exposed to air  Gently trim edges as they roll up Do not cover, leave open so it can dry out    DVT/PE prophylaxis: aspirin daily until further notice   Diet: as you were eating previously.  Can use over the counter stool softeners and bowel preparations, such as Miralax, to help with bowel movements.  Narcotics can be constipating.  Be sure to drink plenty of fluids  PAIN MEDICATION USE AND EXPECTATIONS  You have likely been given narcotic medications to help control your pain.  After a traumatic event that results in an fracture (broken bone) with or without surgery, it is ok to use narcotic pain medications to help control one's pain.  We understand that everyone responds to pain differently and each individual patient will be evaluated on a regular basis for the continued need for narcotic medications. Ideally, narcotic medication use should last no more than 6-8 weeks (coinciding with fracture healing).   As a patient it is your responsibility as well to monitor narcotic medication use and report the amount and frequency you use these medications when you  come to your office visit.   We would also advise that if you are using narcotic medications, you should take a dose prior to therapy to maximize you participation.  IF YOU ARE ON NARCOTIC MEDICATIONS IT IS NOT PERMISSIBLE TO OPERATE A MOTOR VEHICLE (MOTORCYCLE/CAR/TRUCK/MOPED) OR HEAVY MACHINERY DO NOT MIX NARCOTICS WITH OTHER CNS (CENTRAL NERVOUS SYSTEM) DEPRESSANTS SUCH AS ALCOHOL   STOP SMOKING OR USING NICOTINE PRODUCTS!!!!  As discussed nicotine severely impairs your body's ability to heal surgical and traumatic wounds but also impairs bone healing.  Wounds and bone heal by forming microscopic blood vessels (angiogenesis) and nicotine is a vasoconstrictor (essentially, shrinks blood vessels).  Therefore, if vasoconstriction occurs to these microscopic blood vessels they essentially disappear and are unable to deliver necessary nutrients to the healing tissue.  This is one modifiable factor that you can do to dramatically increase your chances of healing your injury.    (This means no smoking, no nicotine gum, patches, etc)  DO NOT USE NONSTEROIDAL ANTI-INFLAMMATORY DRUGS (NSAID'S)  Using products such as Advil (ibuprofen), Aleve (naproxen), Motrin (ibuprofen) for additional pain control during fracture healing can delay and/or prevent the healing response.  If you would like to take over the counter (OTC) medication, Tylenol (acetaminophen) is ok.  However, some narcotic medications that are given for pain control contain acetaminophen as well. Therefore, you should not exceed more than 4000 mg of tylenol in a day if you do not have liver disease.  Also note that there are  may OTC medicines, such as cold medicines and allergy medicines that my contain tylenol as well.  If you have any questions about medications and/or interactions please ask your doctor/PA or your pharmacist.      ICE AND ELEVATE INJURED/OPERATIVE EXTREMITY  Using ice and elevating the injured extremity above your heart can  help with swelling and pain control.  Icing in a pulsatile fashion, such as 20 minutes on and 20 minutes off, can be followed.    Do not place ice directly on skin. Make sure there is a barrier between to skin and the ice pack.    Using frozen items such as frozen peas works well as the conform nicely to the are that needs to be iced.  USE AN ACE WRAP OR TED HOSE FOR SWELLING CONTROL  In addition to icing and elevation, Ace wraps or TED hose are used to help limit and resolve swelling.  It is recommended to use Ace wraps or TED hose until you are informed to stop.    When using Ace Wraps start the wrapping distally (farthest away from the body) and wrap proximally (closer to the body)   Example: If you had surgery on your leg or thing and you do not have a splint on, start the ace wrap at the toes and work your way up to the thigh        If you had surgery on your upper extremity and do not have a splint on, start the ace wrap at your fingers and work your way up to the upper arm  IF YOU ARE IN A SPLINT OR CAST DO NOT REMOVE IT FOR ANY REASON   If your splint gets wet for any reason please contact the office immediately. You may shower in your splint or cast as long as you keep it dry.  This can be done by wrapping in a cast cover or garbage back (or similar)  Do Not stick any thing down your splint or cast such as pencils, money, or hangers to try and scratch yourself with.  If you feel itchy take benadryl as prescribed on the bottle for itching  IF YOU ARE IN A CAM BOOT (BLACK BOOT)  You may remove boot periodically. Perform daily dressing changes as noted below.  Wash the liner of the boot regularly and wear a sock when wearing the boot. It is recommended that you sleep in the boot until told otherwise    Call office for the following:  Temperature greater than 101F  Persistent nausea and vomiting  Severe uncontrolled pain  Redness, tenderness, or signs of infection (pain, swelling,  redness, odor or green/yellow discharge around the site)  Difficulty breathing, headache or visual disturbances  Hives  Persistent dizziness or light-headedness  Extreme fatigue  Any other questions or concerns you may have after discharge  In an emergency, call 911 or go to an Emergency Department at a nearby hospital  CALL THE OFFICE WITH ANY QUESTIONS OR CONCERNS: [phone]   VISIT OUR WEBSITE FOR ADDITIONAL INFORMATION: [email or web address]

## 2020-04-14 NOTE — Progress Notes (Signed)
Provided discharge education/instructions to Pt and his mother. Switched to KCI wound vac, demonstrated changing of cannister and charging of device, instruction given on wound care to LLE. KCI wound vac functioning well, no error noted. All questions and concerns addressed, Pt not in distress, to discharge home once PICC had been capped for home use by IV team.

## 2020-04-17 ENCOUNTER — Encounter (HOSPITAL_COMMUNITY): Payer: Self-pay

## 2020-04-17 ENCOUNTER — Other Ambulatory Visit: Payer: Self-pay

## 2020-04-17 ENCOUNTER — Emergency Department (HOSPITAL_COMMUNITY)
Admission: EM | Admit: 2020-04-17 | Discharge: 2020-04-17 | Disposition: A | Discharge status: Home / Self Care | Payer: Medicaid Other | Attending: Emergency Medicine | Admitting: Emergency Medicine

## 2020-04-17 DIAGNOSIS — Z5321 Procedure and treatment not carried out due to patient leaving prior to being seen by health care provider: Secondary | ICD-10-CM | POA: Insufficient documentation

## 2020-04-17 DIAGNOSIS — T82594A Other mechanical complication of infusion catheter, initial encounter: Secondary | ICD-10-CM | POA: Insufficient documentation

## 2020-04-17 DIAGNOSIS — Y69 Unspecified misadventure during surgical and medical care: Secondary | ICD-10-CM | POA: Insufficient documentation

## 2020-04-17 MED ORDER — ALTEPLASE 2 MG IJ SOLR
2.0000 mg | Freq: Once | INTRAMUSCULAR | Status: DC
  Filled 2020-04-17: qty 2

## 2020-04-17 MED ORDER — HEPARIN SOD (PORK) LOCK FLUSH 100 UNIT/ML IV SOLN
250.0000 [IU] | INTRAVENOUS | Status: AC | PRN
  Administered 2020-04-17: 250 [IU]
  Filled 2020-04-17: qty 2.5

## 2020-04-17 NOTE — ED Triage Notes (Signed)
Patient here reporting that PICC line in right arm is clotted, receiving antibiotics in same x 3 per day.

## 2020-04-17 NOTE — Progress Notes (Signed)
PICC line flushed with saline with great blood return. Dressing clean, dry, and intact. Flushed with heparin for discharge. Asher Muir, RN made aware.

## 2020-04-28 ENCOUNTER — Telehealth: Payer: Self-pay | Admitting: Internal Medicine

## 2020-04-28 NOTE — Telephone Encounter (Signed)
I received a phone call from Amy at [facility] letting me know that Mr. Michael Price has been refusing nurse visits.  He has told his nurse that he is staying at a friend's house and will not give her the address.  His PICC dressing is over 20 week old.  He has not had labs drawn recently.  He only has enough vancomycin to last through tomorrow.  I asked her to call Mr. Michael Price and offered to drop off more vancomycin at his house for him to pick up.  I called Mr. Michael Price cell phone but got no answer.  I was unable to leave a message.  He is scheduled to see me in 1 week.
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
## 2020-05-03 NOTE — Telephone Encounter (Signed)
He is scheduled to see me on 05/05/2020.

## 2020-05-05 ENCOUNTER — Other Ambulatory Visit: Payer: Self-pay

## 2020-05-05 ENCOUNTER — Ambulatory Visit: Payer: Self-pay | Admitting: Internal Medicine

## 2020-05-05 ENCOUNTER — Ambulatory Visit (INDEPENDENT_AMBULATORY_CARE_PROVIDER_SITE_OTHER): Payer: Self-pay | Admitting: Internal Medicine

## 2020-05-05 ENCOUNTER — Encounter: Payer: Self-pay | Admitting: Internal Medicine

## 2020-05-05 DIAGNOSIS — T847XXD Infection and inflammatory reaction due to other internal orthopedic prosthetic devices, implants and grafts, subsequent encounter: Secondary | ICD-10-CM

## 2020-05-05 MED ORDER — AMOXICILLIN-POT CLAVULANATE 875-125 MG PO TABS: 1.0000 | ORAL_TABLET | Freq: Two times a day (BID) | ORAL | 1 refills | Status: DC

## 2020-05-05 MED ORDER — DOXYCYCLINE HYCLATE 100 MG PO TABS: 100.0000 mg | ORAL_TABLET | Freq: Two times a day (BID) | ORAL | 1 refills | Status: DC

## 2020-05-05 NOTE — Assessment & Plan Note (Signed)
He has proven that we cannot safely continue outpatient IV antibiotic therapy.  I have ordered repeat inflammatory markers today and had his PICC removed.  I will switch his antibiotic therapy to doxycycline and add amoxicillin clavulanate and see him back in 1 month.
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
## 2020-05-05 NOTE — Progress Notes (Signed)
Per verbal order from Dr Orvan Falconer, 39 cm Single Lumen Peripherally Inserted Central Catheter removed from right brachial, tip intact. No sutures present. RN confirmed length per chart. Dressing was reinforced multiple times and dirty. No blood return noted. Dressing did not appear to be changed recently. Petroleum dressing applied. Pt advised no heavy lifting with this arm, leave dressing for 24 hours and call the office or seek emergent care if dressing becomes soaked with blood or swelling or sharp pain presents. Patient verbalized understanding and agreement.  Patient's questions answered to their satisfaction. Patient tolerated procedure well, RN walked patient to check out. Pharmacy notified.

## 2020-05-06 LAB — SEDIMENTATION RATE: Sed Rate: 6 mm/h (ref 0–15)

## 2020-05-06 LAB — C-REACTIVE PROTEIN: CRP: 1 mg/L (ref <8.0)

## 2020-05-18 ENCOUNTER — Encounter (HOSPITAL_COMMUNITY): Payer: Self-pay | Admitting: Orthopedic Surgery

## 2020-05-18 DIAGNOSIS — E559 Vitamin D deficiency, unspecified: Secondary | ICD-10-CM

## 2020-05-18 HISTORY — DX: Vitamin D deficiency, unspecified: E55.9
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
## 2020-06-07 ENCOUNTER — Ambulatory Visit: Payer: Self-pay | Admitting: Internal Medicine

## 2020-06-14 ENCOUNTER — Other Ambulatory Visit: Payer: Self-pay | Admitting: *Deleted

## 2020-06-14 DIAGNOSIS — S45102D Unspecified injury of brachial artery, left side, subsequent encounter: Secondary | ICD-10-CM

## 2020-06-29 ENCOUNTER — Ambulatory Visit: Payer: Medicaid Other | Admitting: Vascular Surgery

## 2020-06-29 ENCOUNTER — Other Ambulatory Visit (HOSPITAL_COMMUNITY): Payer: Medicaid Other

## 2020-10-27 ENCOUNTER — Other Ambulatory Visit: Payer: Self-pay | Admitting: Internal Medicine

## 2020-10-27 DIAGNOSIS — T847XXD Infection and inflammatory reaction due to other internal orthopedic prosthetic devices, implants and grafts, subsequent encounter: Secondary | ICD-10-CM

## 2020-10-27 NOTE — Telephone Encounter (Signed)
Please advise on refill.  Left voicemail for patient to schedule overdue appt. Last seen in 04/2020

## 2020-10-31 NOTE — Telephone Encounter (Signed)
Please schedule him to see me this week. No refills till then.

## 2020-10-31 NOTE — Telephone Encounter (Signed)
Called patient and left a voicemail for him to call and schedule an appointment
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
## 2021-10-04 IMAGING — CR DG PELVIS 3+V JUDET
3 series · 3 of 3 positions shown · non-contrast
Comparison: March 17, 2020

CLINICAL DATA: Pain

EXAM:
JUDET PELVIS - 3+ VIEW

[pelvis ap]
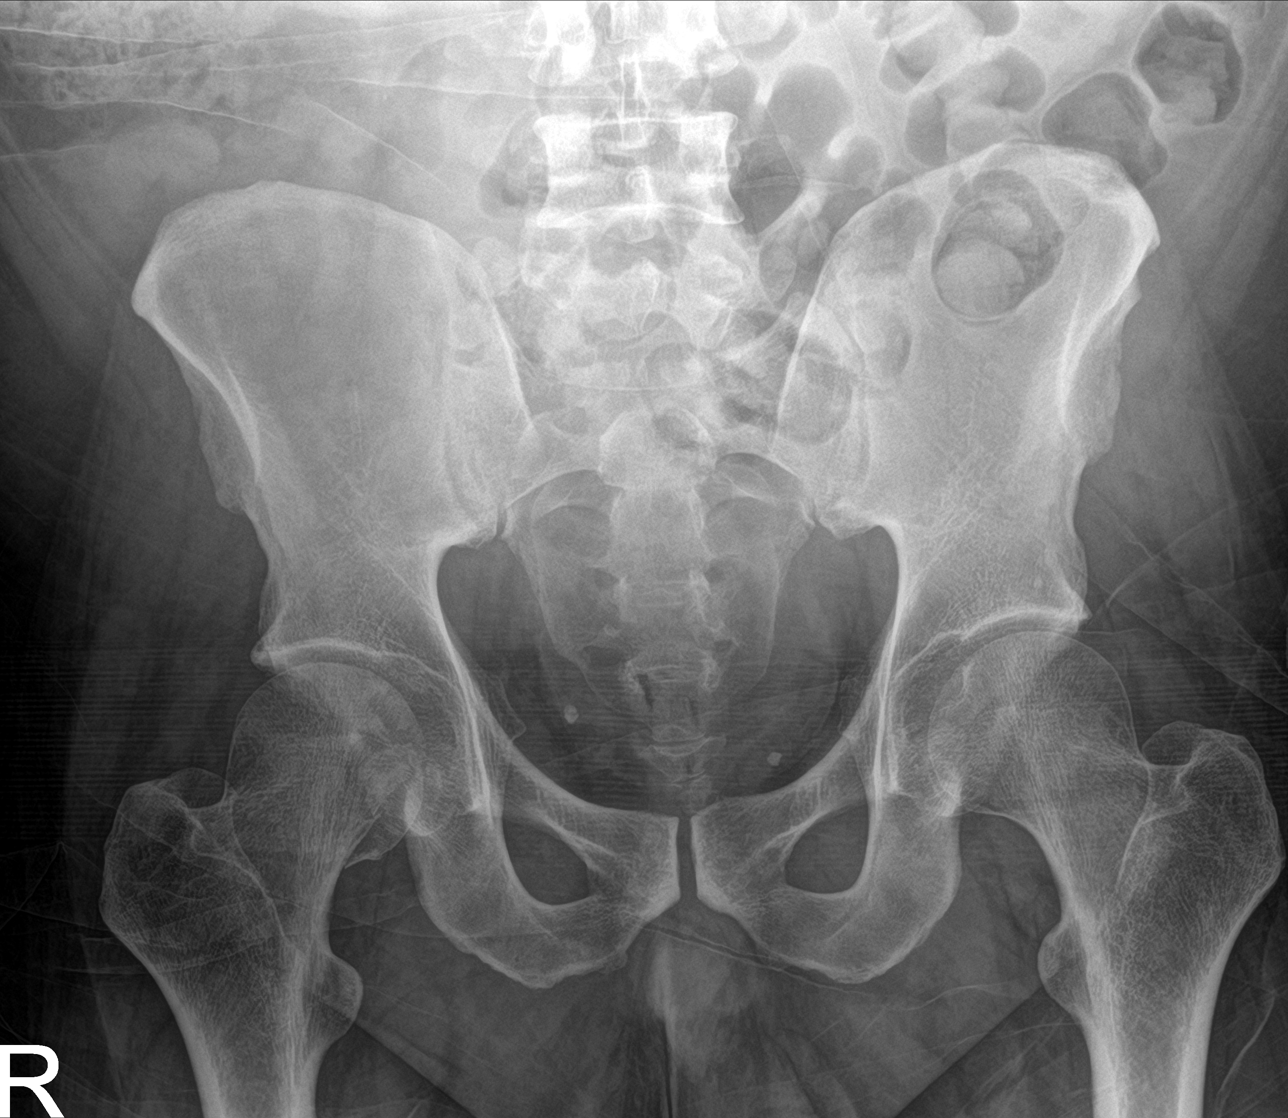

[pelvis obl (1 of 2)]
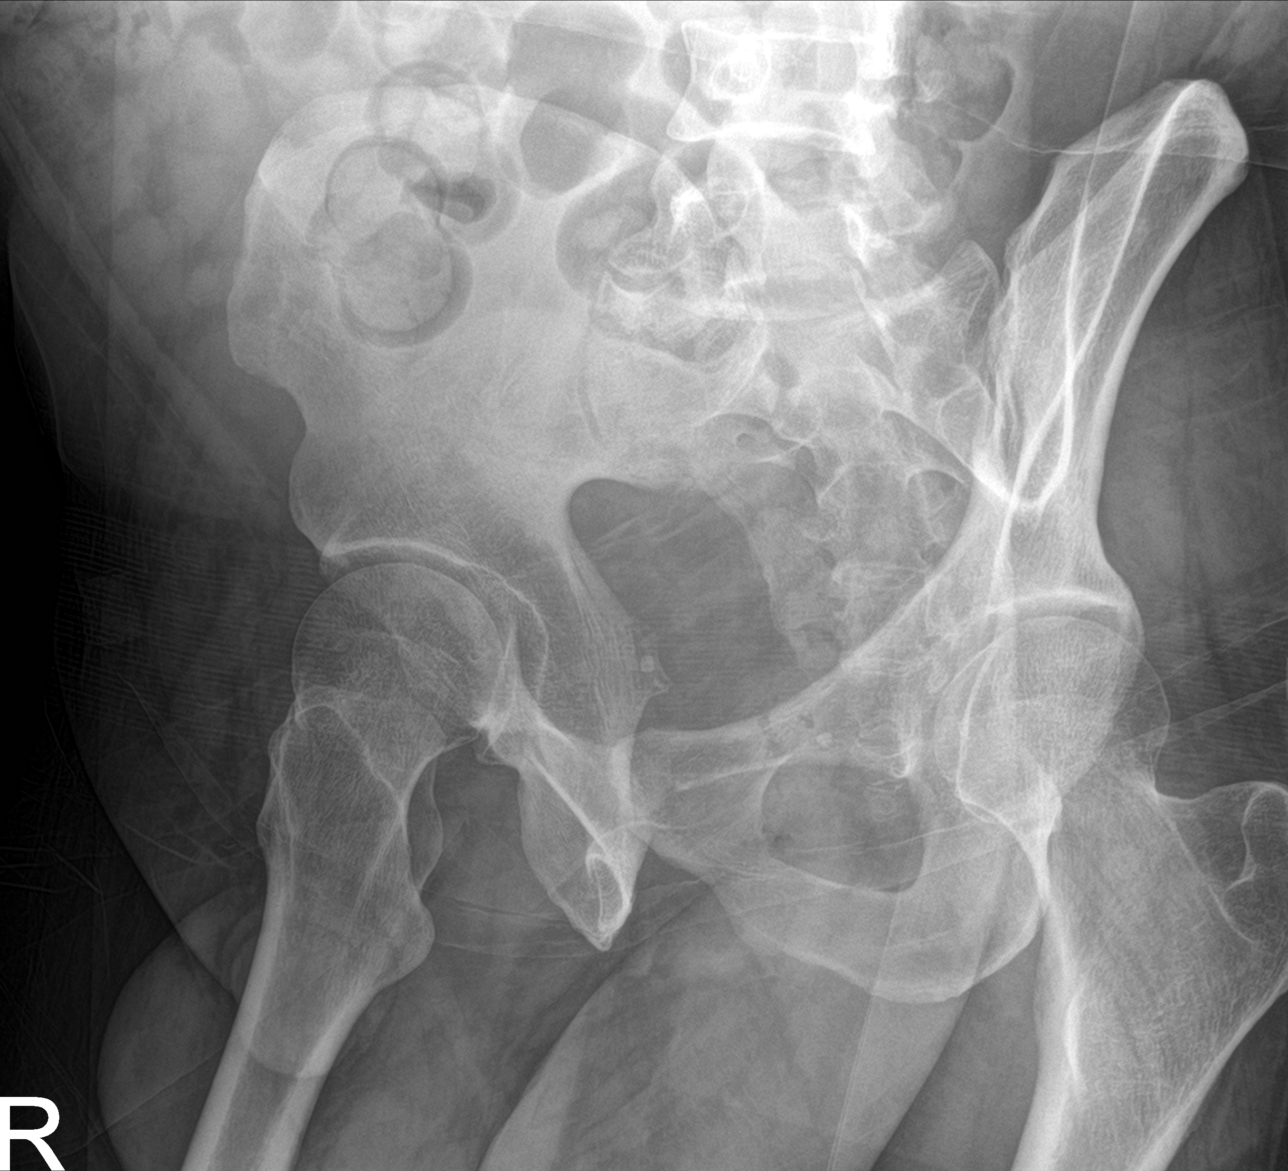

[pelvis obl (2 of 2)]
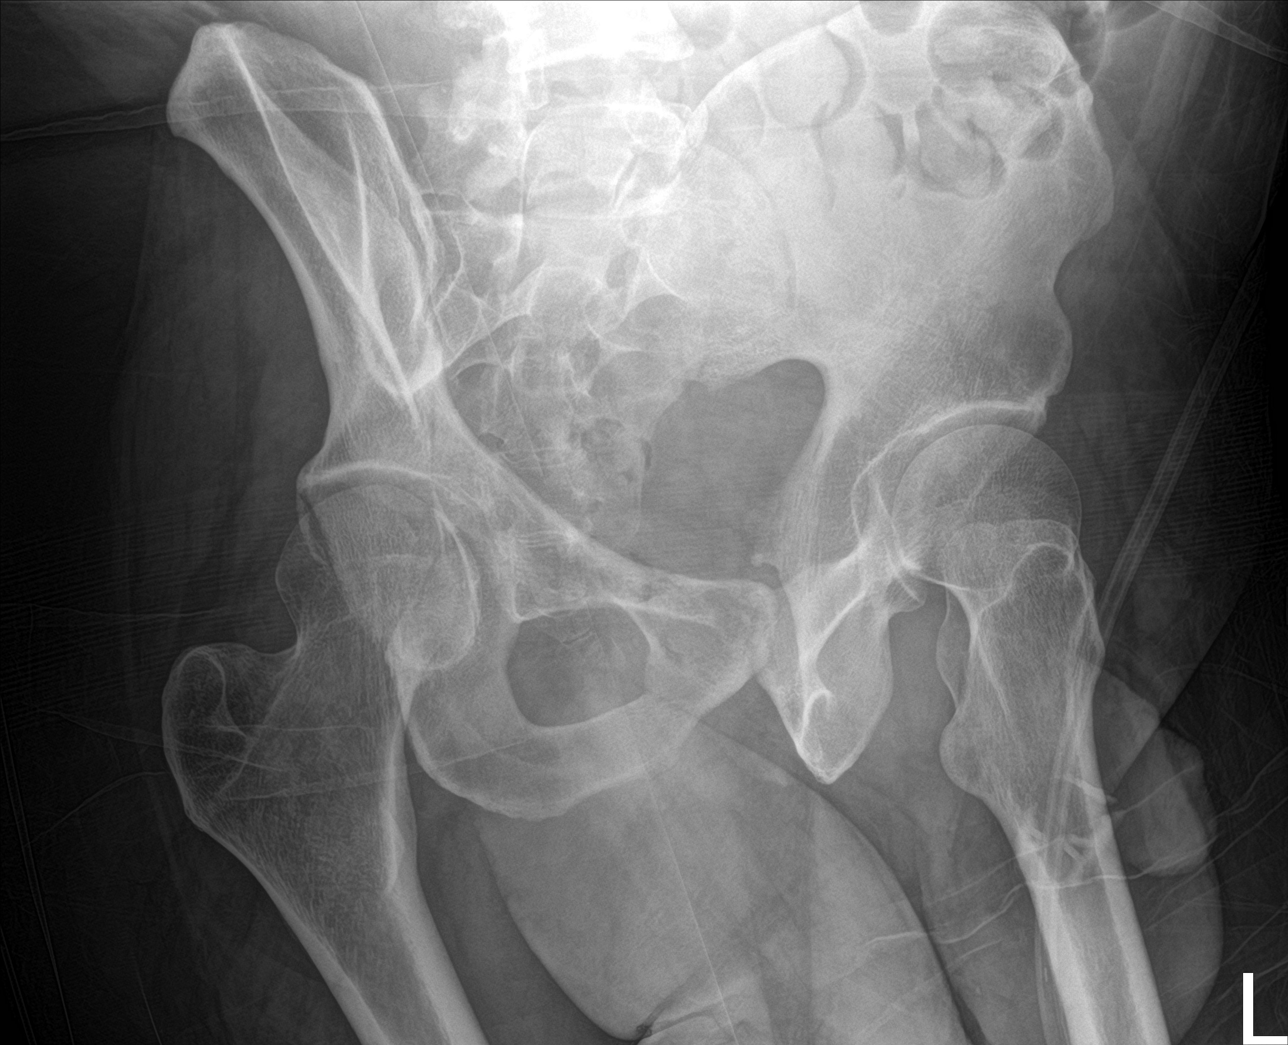

[3 of 3 positions shown; findings below may reference images not displayed]

FINDINGS: There is been interval reduction the right hip dislocation. Femoral
head is within the acetabulum. Small fracture fragment is again
identified at the inferior aspect of the femoral head. There is
small fracture fragments at the posterosuperior acetabulum. The
sacroiliac joints appear to be intact.
IMPRESSION: Interval reduction the right hip dislocation which appears to be in
near anatomic alignment.

Fractures of the inferior femoral head and posterior acetabulum.

## 2021-11-23 DIAGNOSIS — R69 Illness, unspecified: Secondary | ICD-10-CM | POA: Diagnosis not present

## 2021-11-23 DIAGNOSIS — H6590 Unspecified nonsuppurative otitis media, unspecified ear: Secondary | ICD-10-CM | POA: Diagnosis not present

## 2021-12-12 DIAGNOSIS — M542 Cervicalgia: Secondary | ICD-10-CM | POA: Diagnosis not present

## 2021-12-12 DIAGNOSIS — Z79891 Long term (current) use of opiate analgesic: Secondary | ICD-10-CM | POA: Diagnosis not present

## 2021-12-12 DIAGNOSIS — G5642 Causalgia of left upper limb: Secondary | ICD-10-CM | POA: Diagnosis not present

## 2021-12-12 DIAGNOSIS — G8921 Chronic pain due to trauma: Secondary | ICD-10-CM | POA: Diagnosis not present

## 2022-02-12 ENCOUNTER — Other Ambulatory Visit: Payer: Self-pay

## 2022-02-12 ENCOUNTER — Emergency Department (HOSPITAL_COMMUNITY)
Admission: EM | Admit: 2022-02-12 | Discharge: 2022-02-13 | Disposition: A | Discharge status: Home / Self Care | Payer: 59 | Attending: Emergency Medicine | Admitting: Emergency Medicine

## 2022-02-12 ENCOUNTER — Encounter (HOSPITAL_COMMUNITY): Payer: Self-pay | Admitting: Emergency Medicine

## 2022-02-12 ENCOUNTER — Emergency Department (HOSPITAL_COMMUNITY): Payer: 59

## 2022-02-12 DIAGNOSIS — Z79899 Other long term (current) drug therapy: Secondary | ICD-10-CM | POA: Insufficient documentation

## 2022-02-12 DIAGNOSIS — G8929 Other chronic pain: Secondary | ICD-10-CM | POA: Diagnosis not present

## 2022-02-12 DIAGNOSIS — Z7982 Long term (current) use of aspirin: Secondary | ICD-10-CM | POA: Insufficient documentation

## 2022-02-12 DIAGNOSIS — M79602 Pain in left arm: Secondary | ICD-10-CM | POA: Insufficient documentation

## 2022-02-12 DIAGNOSIS — M79622 Pain in left upper arm: Secondary | ICD-10-CM | POA: Diagnosis not present

## 2022-02-12 LAB — BASIC METABOLIC PANEL
Anion gap: 9 (ref 5–15)
BUN: 7 mg/dL (ref 6–20)
CO2: 21 mmol/L — ABNORMAL LOW (ref 22–32)
Calcium: 9.2 mg/dL (ref 8.9–10.3)
Chloride: 112 mmol/L — ABNORMAL HIGH (ref 98–111)
Creatinine, Ser: 0.85 mg/dL (ref 0.61–1.24)
GFR, Estimated: >60 mL/min (ref >60)
Glucose, Bld: 105 mg/dL — ABNORMAL HIGH (ref 70–99)
Potassium: 3.8 mmol/L (ref 3.5–5.1)
Sodium: 142 mmol/L (ref 135–145)

## 2022-02-12 LAB — CBC WITH DIFFERENTIAL/PLATELET
Abs Immature Granulocytes: 0.02 10*3/uL (ref 0.00–0.07)
Basophils Absolute: 0 10*3/uL (ref 0.0–0.1)
Basophils Relative: 0 %
Eosinophils Absolute: 0.2 10*3/uL (ref 0.0–0.5)
Eosinophils Relative: 2 %
HCT: 46.1 % (ref 39.0–52.0)
Hemoglobin: 14.6 g/dL (ref 13.0–17.0)
Immature Granulocytes: 0 %
Lymphocytes Relative: 23 %
Lymphs Abs: 1.5 10*3/uL (ref 0.7–4.0)
MCH: 28.3 pg (ref 26.0–34.0)
MCHC: 31.7 g/dL (ref 30.0–36.0)
MCV: 89.3 fL (ref 80.0–100.0)
Monocytes Absolute: 0.6 10*3/uL (ref 0.1–1.0)
Monocytes Relative: 9 %
Neutro Abs: 4.4 10*3/uL (ref 1.7–7.7)
Neutrophils Relative %: 66 %
Platelets: 287 10*3/uL (ref 150–400)
RBC: 5.16 MIL/uL (ref 4.22–5.81)
RDW: 12.6 % (ref 11.5–15.5)
WBC: 6.7 10*3/uL (ref 4.0–10.5)
nRBC: 0 % (ref 0.0–0.2)
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
## 2022-02-13 MED ORDER — MELOXICAM 7.5 MG PO TABS: 7.5000 mg | ORAL_TABLET | Freq: Every day | ORAL | 0 refills | Status: DC

## 2022-02-13 MED ORDER — LIDOCAINE 5 % EX PTCH: 1.0000 | MEDICATED_PATCH | CUTANEOUS | 0 refills | Status: DC

## 2022-02-13 MED ORDER — KETOROLAC TROMETHAMINE 30 MG/ML IJ SOLN
30.0000 mg | Freq: Once | INTRAMUSCULAR | Status: AC
  Administered 2022-02-13: 30 mg via INTRAMUSCULAR
  Filled 2022-02-13: qty 1

## 2022-02-13 MED ORDER — LIDOCAINE 5 % EX PTCH
1.0000 | MEDICATED_PATCH | Freq: Once | CUTANEOUS | Status: DC
  Administered 2022-02-13: 1 via TRANSDERMAL
  Filled 2022-02-13: qty 1
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
## 2022-02-13 NOTE — Discharge Instructions (Signed)
Please read and follow all provided instructions.  Your diagnoses today include:  1. Chronic pain of left upper extremity     Tests performed today include: X-ray of your arm: shows no acute findings Vital signs. See below for your results today.   Medications prescribed:  Meloxicam - anti-inflammatory pain medication  You have been prescribed an anti-inflammatory medication or NSAID. Take with food. Do not take aspirin, ibuprofen, or naproxen if taking this medication. Take smallest effective dose for the shortest duration needed for your pain. Stop taking if you experience stomach pain or vomiting.   Take any prescribed medications only as directed.  Home care instructions:  Follow any educational materials contained in this packet.  BE VERY CAREFUL not to take multiple medicines containing Tylenol (also called acetaminophen). Doing so can lead to an overdose which can damage your liver and cause liver failure and possibly death.   Follow-up instructions: Please follow-up with your primary care provider in the next 3 days for further evaluation of your symptoms.   Return instructions:  Please return to the Emergency Department if you experience worsening symptoms.  Please return if you have any other emergent concerns.  Additional Information:  Your vital signs today were: BP 120/75   Pulse 78   Temp 98.6 F (37 C)   Resp 16   Ht 6' (1.829 m)   Wt 116 kg   SpO2 98%   BMI 34.68 kg/m  If your blood pressure (BP) was elevated above 135/85 this visit, please have this repeated by your doctor within one month. --------------

## 2022-02-19 DIAGNOSIS — G5642 Causalgia of left upper limb: Secondary | ICD-10-CM | POA: Diagnosis not present

## 2022-02-19 DIAGNOSIS — Z79891 Long term (current) use of opiate analgesic: Secondary | ICD-10-CM | POA: Diagnosis not present

## 2022-02-19 DIAGNOSIS — G8921 Chronic pain due to trauma: Secondary | ICD-10-CM | POA: Diagnosis not present

## 2022-02-19 DIAGNOSIS — M542 Cervicalgia: Secondary | ICD-10-CM | POA: Diagnosis not present

## 2022-04-17 DIAGNOSIS — G8921 Chronic pain due to trauma: Secondary | ICD-10-CM | POA: Diagnosis not present

## 2022-04-17 DIAGNOSIS — M542 Cervicalgia: Secondary | ICD-10-CM | POA: Diagnosis not present

## 2022-04-17 DIAGNOSIS — G5642 Causalgia of left upper limb: Secondary | ICD-10-CM | POA: Diagnosis not present

## 2022-04-17 DIAGNOSIS — Z79891 Long term (current) use of opiate analgesic: Secondary | ICD-10-CM | POA: Diagnosis not present

## 2023-06-26 ENCOUNTER — Encounter (HOSPITAL_COMMUNITY): Payer: Self-pay

## 2023-06-26 ENCOUNTER — Emergency Department (HOSPITAL_COMMUNITY)
Admission: EM | Admit: 2023-06-26 | Discharge: 2023-06-27 | Disposition: A | Discharge status: Home / Self Care | Payer: Medicaid Other | Attending: Emergency Medicine | Admitting: Emergency Medicine

## 2023-06-26 ENCOUNTER — Emergency Department (HOSPITAL_COMMUNITY): Payer: Medicaid Other

## 2023-06-26 DIAGNOSIS — S7011XA Contusion of right thigh, initial encounter: Secondary | ICD-10-CM | POA: Insufficient documentation

## 2023-06-26 DIAGNOSIS — S40022A Contusion of left upper arm, initial encounter: Secondary | ICD-10-CM | POA: Diagnosis not present

## 2023-06-26 DIAGNOSIS — W19XXXA Unspecified fall, initial encounter: Secondary | ICD-10-CM

## 2023-06-26 DIAGNOSIS — Y9301 Activity, walking, marching and hiking: Secondary | ICD-10-CM | POA: Diagnosis not present

## 2023-06-26 DIAGNOSIS — S8011XA Contusion of right lower leg, initial encounter: Secondary | ICD-10-CM | POA: Diagnosis not present

## 2023-06-26 DIAGNOSIS — R030 Elevated blood-pressure reading, without diagnosis of hypertension: Secondary | ICD-10-CM | POA: Diagnosis not present

## 2023-06-26 DIAGNOSIS — Z7982 Long term (current) use of aspirin: Secondary | ICD-10-CM | POA: Diagnosis not present

## 2023-06-26 DIAGNOSIS — W1781XA Fall down embankment (hill), initial encounter: Secondary | ICD-10-CM | POA: Insufficient documentation

## 2023-06-26 DIAGNOSIS — S79921A Unspecified injury of right thigh, initial encounter: Secondary | ICD-10-CM | POA: Diagnosis present

## 2023-06-26 NOTE — ED Triage Notes (Signed)
Pt comes via [facility] from home after after a mechanical fall, no has pain to R leg and hip area.

## 2023-06-27 ENCOUNTER — Emergency Department (HOSPITAL_COMMUNITY): Payer: Medicaid Other

## 2023-06-27 MED ORDER — OXYCODONE HCL 5 MG PO TABS
5.0000 mg | ORAL_TABLET | ORAL | 0 refills | Status: AC | PRN
Start: 2023-06-27 — End: ?
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
## 2023-06-27 NOTE — Discharge Instructions (Addendum)
Apply ice for 30 minutes at a time, 4 times a day.  You may take ibuprofen or naproxen as needed for pain.  To get additional pain relief, add acetaminophen.  Combining acetaminophen with either ibuprofen or naproxen gives you better pain relief and you get from taking either medication by itself.  Discussed with your primary care provider whether referral to pain management would be appropriate.

## 2023-06-27 NOTE — ED Notes (Signed)
Patient transported to X-ray 

## 2023-06-27 NOTE — ED Notes (Signed)
Pt ambulatory to bathroom and back to stretcher

## 2023-06-27 NOTE — ED Notes (Signed)
Pt given sprite 

## 2023-11-26 DEATH — deceased
